# Patient Record
Sex: Female | Born: 1975 | Race: Black or African American | Hispanic: No | Marital: Married | Smoking: Current every day smoker
Health system: Southern US, Community
[De-identification: ages and names within clinical notes are randomized; demographics above are authoritative.]

## PROBLEM LIST (undated history)

## (undated) HISTORY — PX: REPLACEMENT TOTAL HIP W/  RESURFACING IMPLANTS: SUR1222

## (undated) HISTORY — PX: DILATION AND CURETTAGE OF UTERUS: SHX78

---

## 2004-01-31 ENCOUNTER — Emergency Department: Payer: Self-pay | Admitting: Unknown Physician Specialty

## 2006-12-13 ENCOUNTER — Emergency Department: Payer: Self-pay | Admitting: Emergency Medicine

## 2008-04-07 ENCOUNTER — Emergency Department: Payer: Self-pay | Admitting: Emergency Medicine

## 2009-05-19 ENCOUNTER — Ambulatory Visit: Payer: Self-pay | Admitting: Family Medicine

## 2009-06-19 ENCOUNTER — Ambulatory Visit: Payer: Self-pay | Admitting: Family Medicine

## 2009-07-19 ENCOUNTER — Emergency Department: Payer: Self-pay | Admitting: Emergency Medicine

## 2009-10-02 ENCOUNTER — Emergency Department: Payer: Self-pay | Admitting: Internal Medicine

## 2009-10-12 ENCOUNTER — Ambulatory Visit: Payer: Self-pay | Admitting: Gastroenterology

## 2009-10-12 ENCOUNTER — Other Ambulatory Visit: Payer: Self-pay | Admitting: Gastroenterology

## 2010-05-01 ENCOUNTER — Emergency Department: Payer: Self-pay | Admitting: Emergency Medicine

## 2011-12-05 IMAGING — CT CT ABDOMEN W/ CM
1 of 2 series · 15 of 32 positions shown, 20 images · IV contrast (isovue)
Comparison: none

REASON FOR EXAM: nausea vomiting and constipation
COMMENTS:

PROCEDURE:     KCT - KCT ABDOMEN STANDARD W  - October 12, 2009  [DATE]
RESULT:     Comparison:  None
TECHNIQUE: Multiple axial images of the abdomen from the lung bases to the
iliac crests, with p.o. contrast and with 100 mL of Isovue 370 intravenous
contrast.

[Series 2: abd with 5.0 i40f · axial · 0.68mm/px · z∈[-862,-602]mm · 15 of 58 slices shown, 20 images]
[im 3/58  soft-tissue]
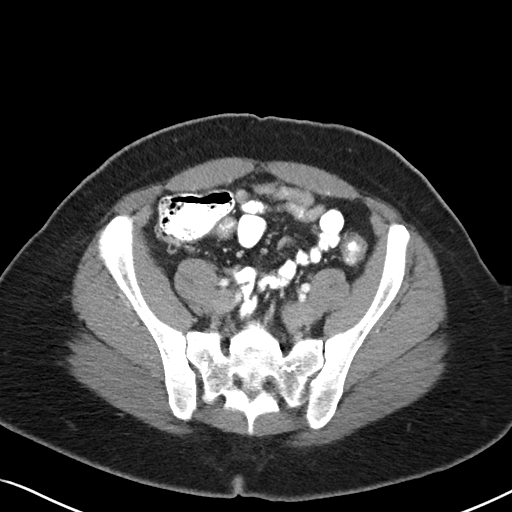
[im 3/58  bone]
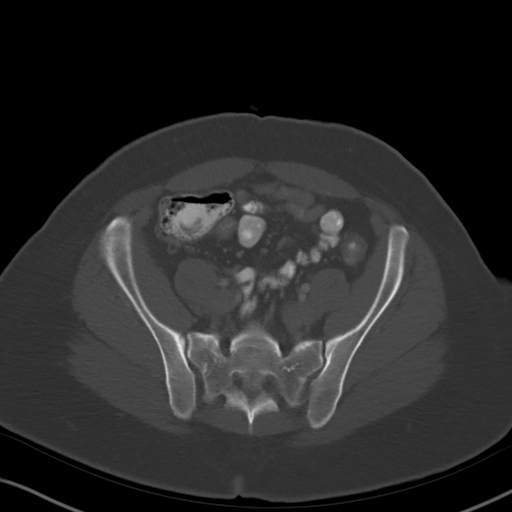
[im 9/58  soft-tissue]
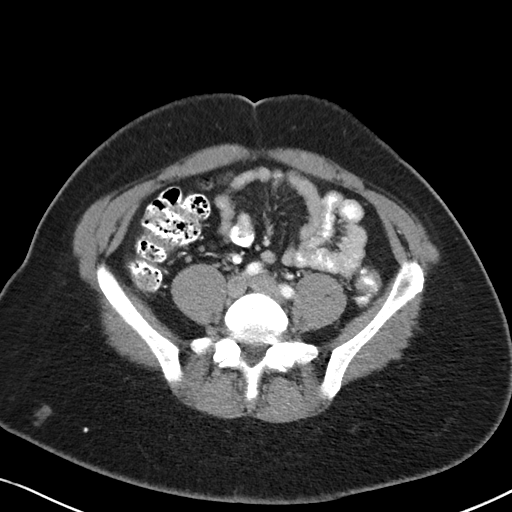
[im 11/58  soft-tissue]
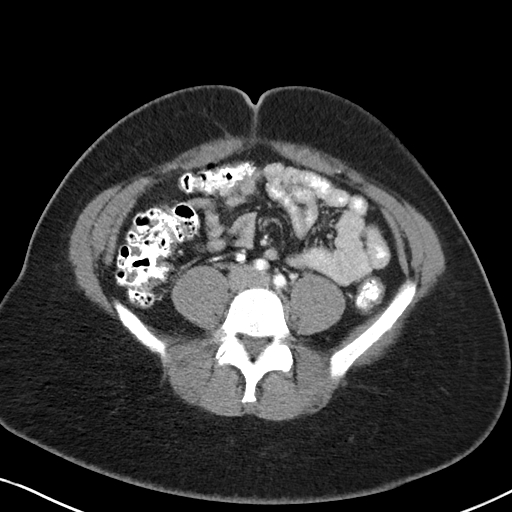
[im 17/58  soft-tissue]
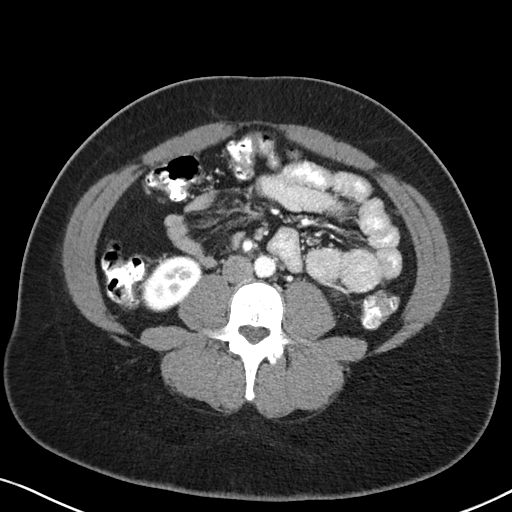
[im 20/58  soft-tissue]
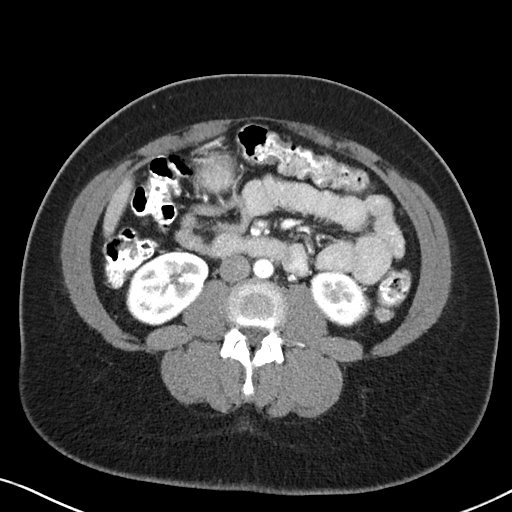
[im 22/58  soft-tissue]
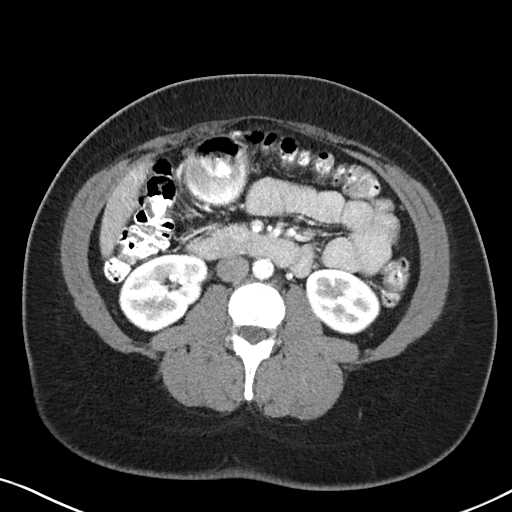
[im 28/58  soft-tissue]
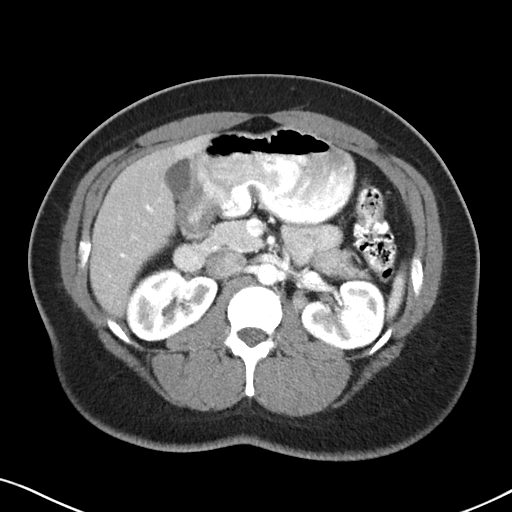
[im 30/58  soft-tissue]
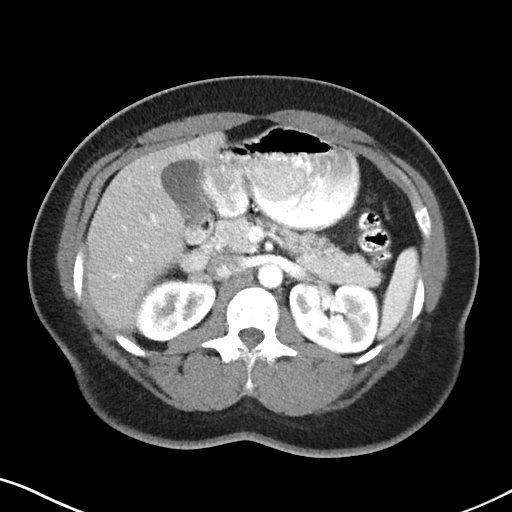
[im 36/58  soft-tissue]
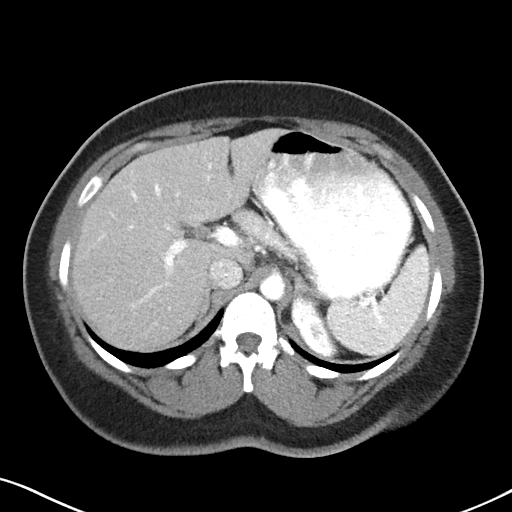
[im 36/58  bone]
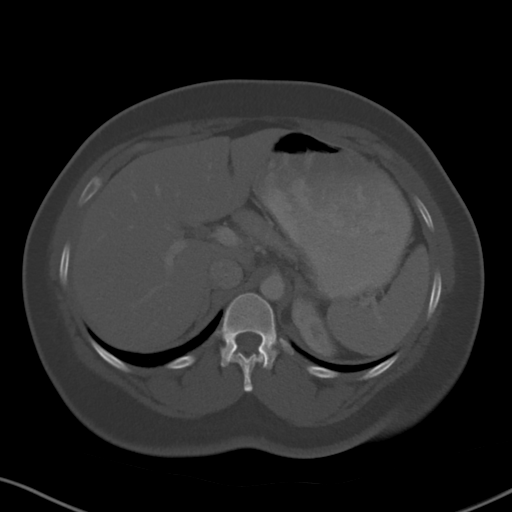
[im 39/58  soft-tissue]
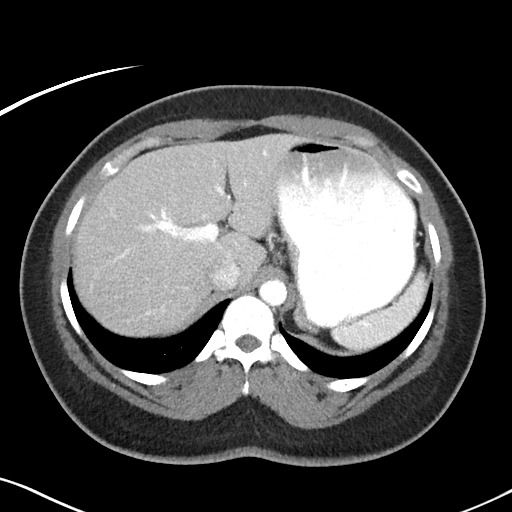
[im 44/58  soft-tissue]
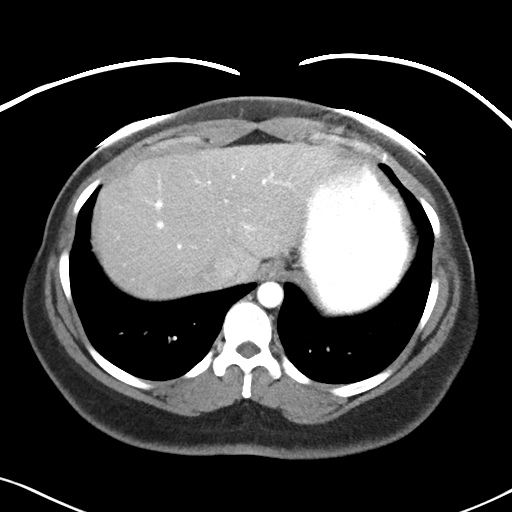
[im 47/58  soft-tissue]
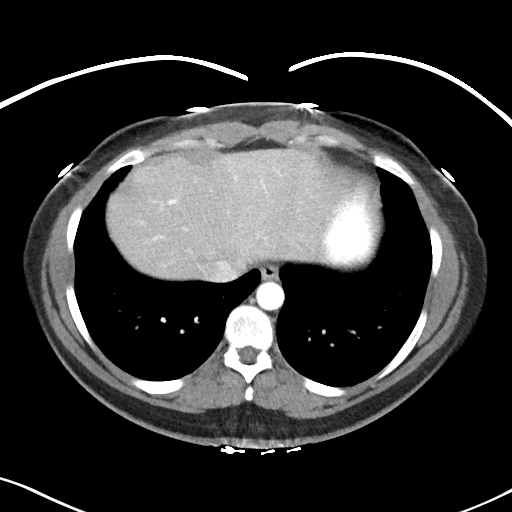
[im 47/58  lung]
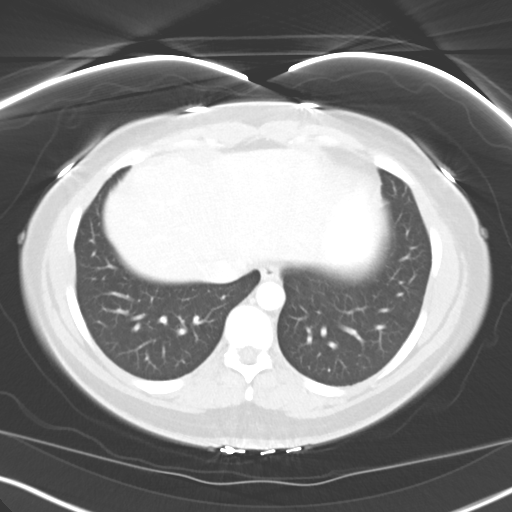
[im 49/58  soft-tissue]
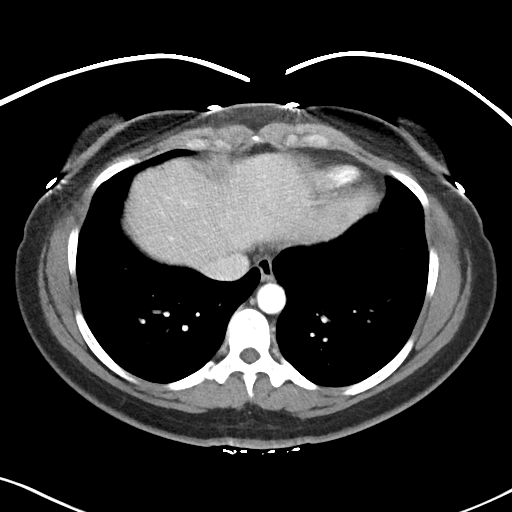
[im 49/58  lung]
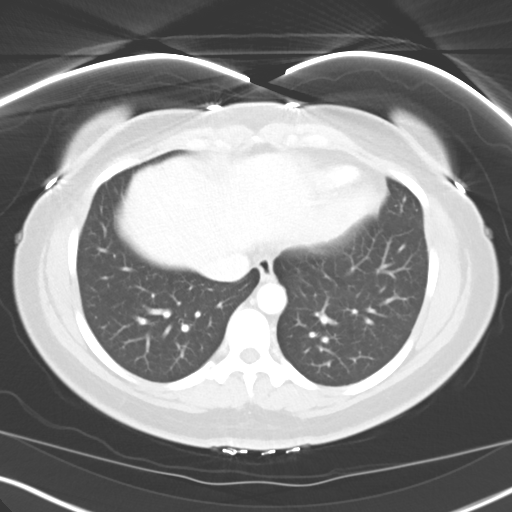
[im 52/58  lung]
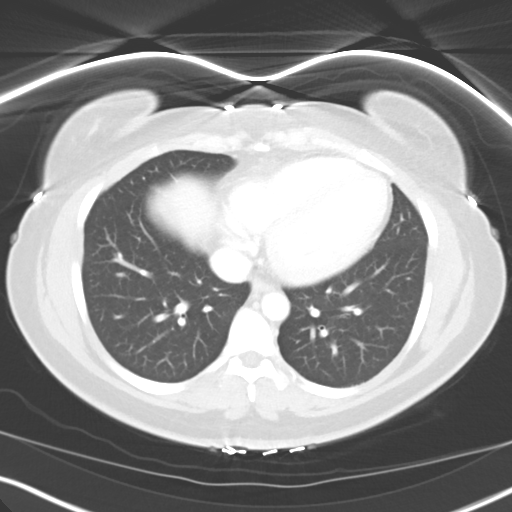
[im 55/58  soft-tissue]
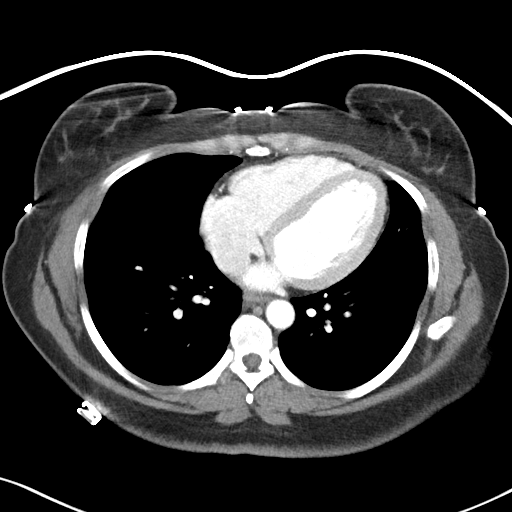
[im 55/58  lung]
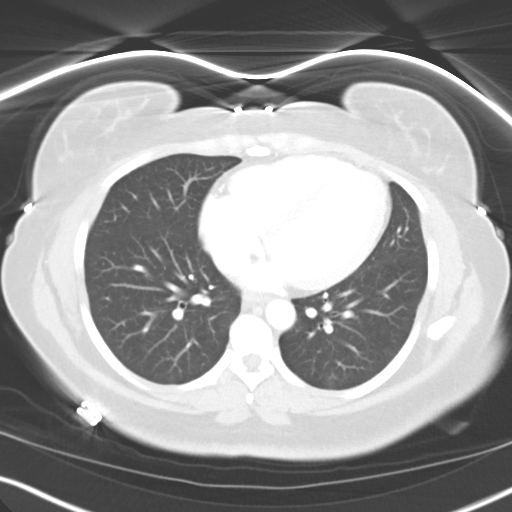

[15 of 32 positions shown; findings below may reference images not displayed]

FINDINGS: The liver, gallbladder, spleen, adrenals, and pancreas are unremarkable. The
kidneys enhance normally.

No lymphadenopathy in the visualized retroperitoneum and mesentery.
Visualized small and large bowel are normal in caliber.

No aggressive lytic or sclerotic osseous lesions.

The
IMPRESSION: No findings in the abdomen to explain the etiology of the patient's pain.

## 2012-06-23 IMAGING — CR DG CHEST 2V
1 series · 2 of 2 positions shown · non-contrast
Comparison: none

REASON FOR EXAM: chest pain and short of breath  -  ed waiting room
COMMENTS:   May transport without cardiac monitor

PROCEDURE:     DXR - DXR CHEST PA (OR AP) AND LATERAL  - May 01, 2010  [DATE]
RESULT:     Comparison: None

[Series 1: view not recorded · 0.17mm/px · 2 of 2 slices shown]
[im 1/2]
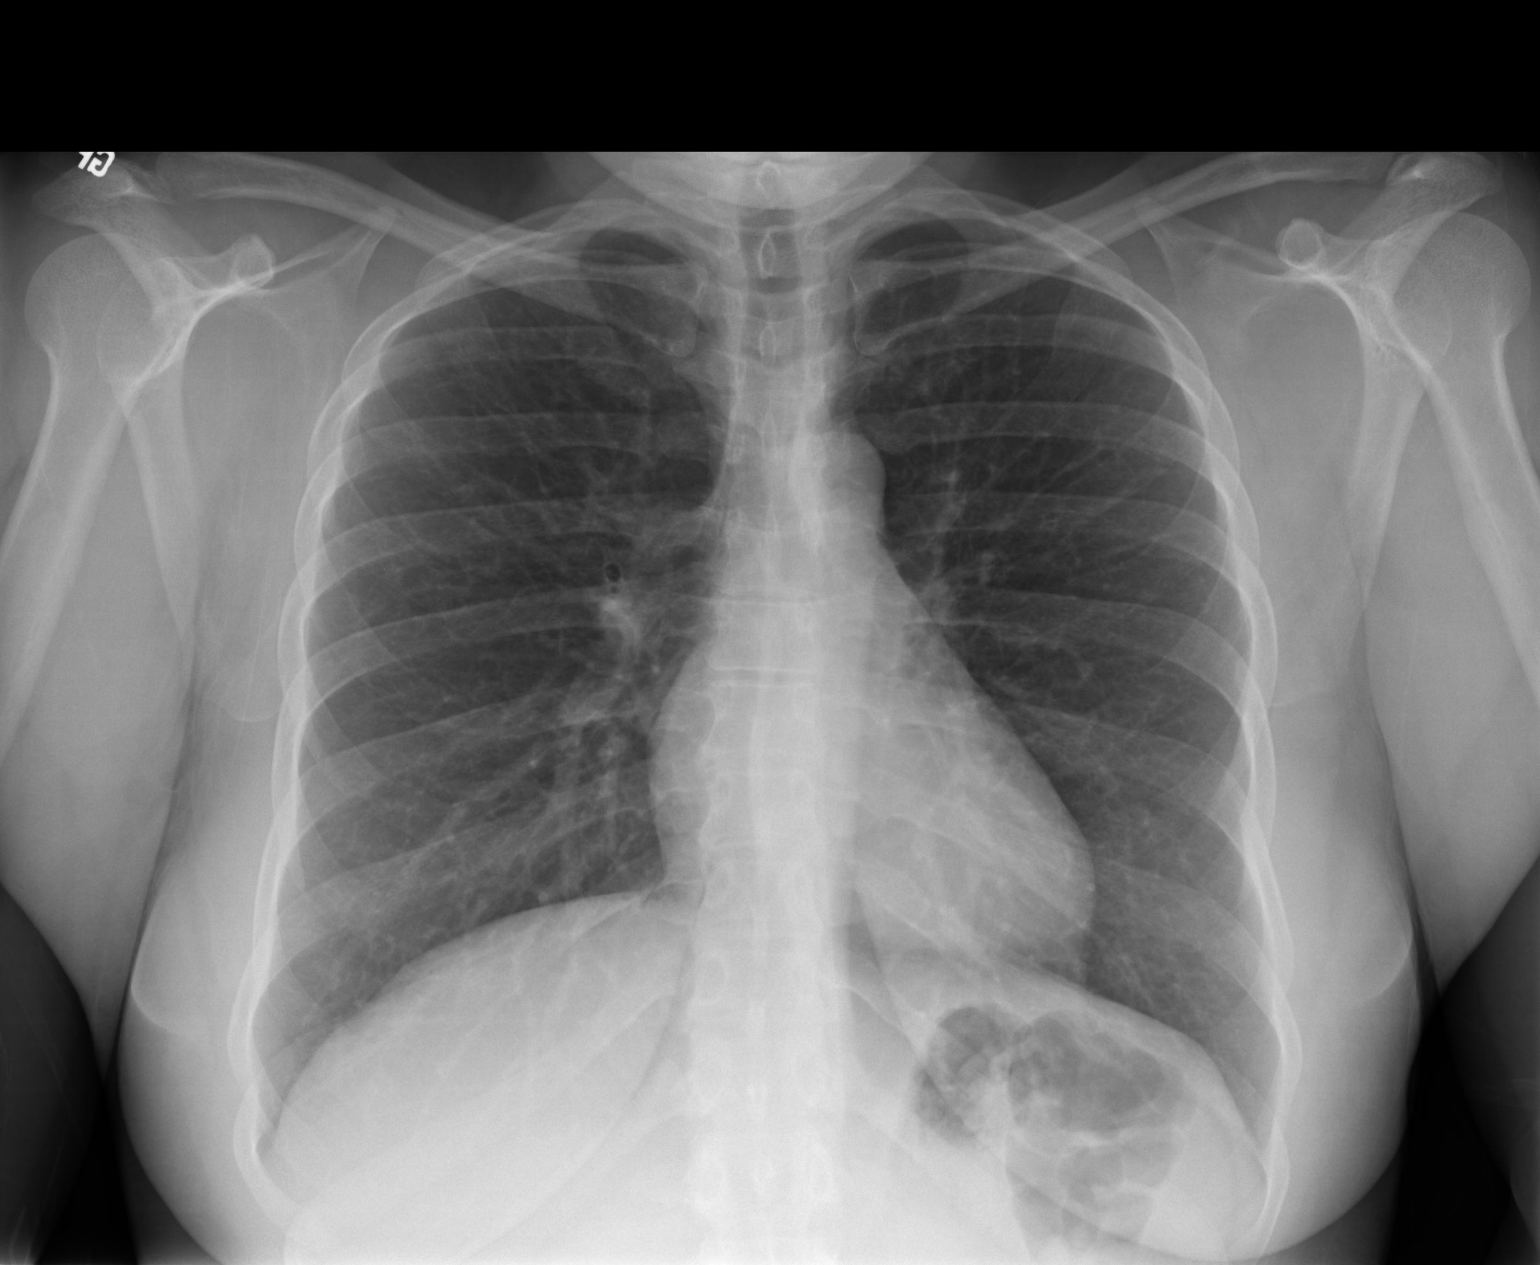
[im 2/2]
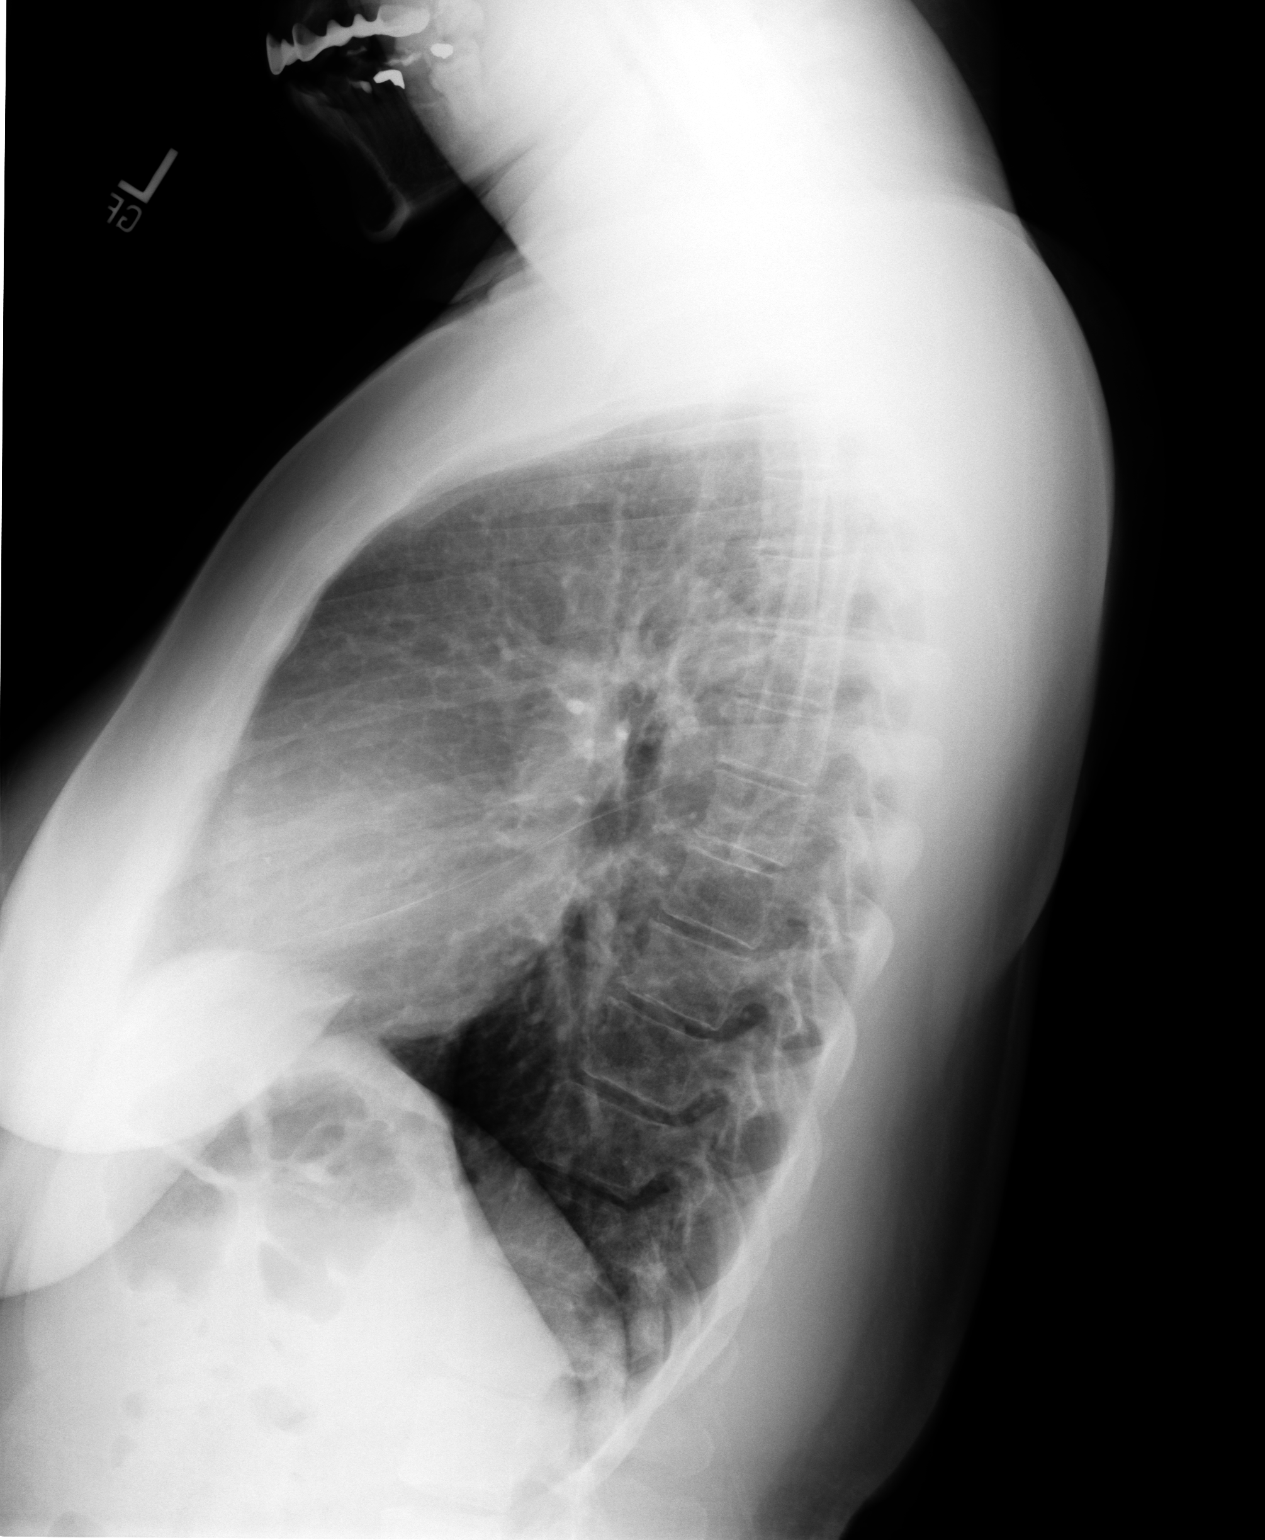

[2 of 2 positions shown; findings below may reference images not displayed]

FINDINGS: PA and lateral chest radiographs are provided.  There is no focal
parenchymal opacity, pleural effusion, or pneumothorax. The heart and
mediastinum are unremarkable.  The osseous structures are unremarkable.
IMPRESSION: No acute disease of the chest.

## 2021-05-28 ENCOUNTER — Encounter: Payer: Self-pay | Admitting: Obstetrics and Gynecology

## 2021-05-31 NOTE — Progress Notes (Signed)
? ? ? ?GYNECOLOGY PROGRESS NOTE ? ?Subjective:  ? ? Patient ID: Gwendolyn Grant, female    DOB: 1975-03-31, 46 y.o.   MRN: [id number] ? ?HPI ? Patient is a 46 y.o. [phone] female who presents as a referral from her PCP [facility] [place], [place]) for abnormal pap smear.  Patient with NILM pap HPV+ (neg 16/18) on 04/22/2021.  Denies complaints today.  ? ? ?Past Medical History:  ?Diagnosis Date  ? Alopecia 06/01/2021  ? Essential hypertension 06/01/2021  ? Hidradenitis suppurativa 06/01/2021  ? Neurogenic sleep apnea 06/01/2021  ? Nicotine dependence, cigarettes, w unsp disorders 06/01/2021  ? Prediabetes 06/01/2021  ? Vitamin D deficiency 06/01/2021  ? ? ?Past Surgical History:  ?Procedure Laterality Date  ? CESAREAN SECTION    ? DILATION AND CURETTAGE OF UTERUS    ? 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 ? ?Social History  ? ?Socioeconomic History  ? Marital status: Married  ?  Spouse name: Not on file  ? Number of children: Not on file  ? Years of education: Not on file  ? Highest education level: Not on file  ?Occupational History  ? Not on file  ?Tobacco Use  ? Smoking status: Every Day  ?  Years: 20.00  ?  Types: Cigarettes  ? Smokeless tobacco: Never  ?Vaping Use  ? Vaping Use: Not on file  ?Substance and Sexual Activity  ? Alcohol use: Not Currently  ? Drug use: Yes  ?  Frequency: 1.0 times per week  ?  Types: Marijuana  ? Sexual activity: Not on file  ?Other Topics Concern  ? Not on file  ?Social History Narrative  ? Not on file  ? ?Social Determinants of Health  ? ?Financial Resource Strain: Not on file  ?Food Insecurity: Not on file  ?Transportation Needs: Not on file  ?Physical Activity: Not on file  ?Stress: Not on file  ?Social Connections: Not on file  ?Intimate Partner Violence: Not on file  ? ? ?Current Outpatient Medications on File Prior to Visit  ?Medication Sig Dispense Refill  ? amLODipine-benazepril (LOTREL) 10-20 MG capsule Take by mouth.    ? LORazepam (ATIVAN) 0.5  MG tablet Take by mouth.    ? ALPRAZolam (XANAX) 0.5 MG tablet Take by mouth.    ? ?No current facility-administered medications on file prior to visit.  ? ? ?Allergies  ?Allergen Reactions  ? Azithromycin Itching  ? Morphine Other (See Comments)  ? ? ?Review of Systems ?A comprehensive review of systems was negative.  ? ?Objective:  ? Blood pressure (!) 158/92, pulse 63, height 5\' 3"  (1.6 m), weight 214 lb (97.1 kg). Body mass index is 37.91 kg/m?. ?General appearance: alert and no distress ?Remainder of exam deferred.  ? ? ?Assessment:  ? ?1. History of infection due to human papilloma virus (HPV)   ? ? ?Plan:  ? ?Patient with HPV infection, however negative for 16/18/45, otherwise normal pap smear. Had discussion regarding HPV infection, including transmission, risk of cervical cancer or other types of cancers based on typing. Advised that although positive, her risk is still fairly low as it is negative for the types that can lead to cervical cancer. She does not require a colposcopy at this time. Advised that we will follow ASCCP guidelines and manage accordingly. Recommendations are for repeat pap smear in 1 year. Given handout on HPV.  ?Discussed Gardasil vaccine, including risks and benefits. Patient notes she would like to start  vaccination series. First dose given today. To f/u in 1 month.  ? ? ?Rubie Maid, MD ?[facility] ? ? ?

## 2021-06-01 ENCOUNTER — Ambulatory Visit (INDEPENDENT_AMBULATORY_CARE_PROVIDER_SITE_OTHER): Payer: BLUE CROSS/BLUE SHIELD | Admitting: Obstetrics and Gynecology

## 2021-06-01 ENCOUNTER — Encounter: Payer: Self-pay | Admitting: Obstetrics and Gynecology

## 2021-06-01 VITALS — BP 158/92 | HR 63 | Ht 63.0 in | Wt 214.0 lb

## 2021-06-01 DIAGNOSIS — Z8619 Personal history of other infectious and parasitic diseases: Secondary | ICD-10-CM | POA: Diagnosis not present

## 2021-06-01 DIAGNOSIS — L659 Nonscarring hair loss, unspecified: Secondary | ICD-10-CM

## 2021-06-01 DIAGNOSIS — Z23 Encounter for immunization: Secondary | ICD-10-CM | POA: Diagnosis not present

## 2021-06-01 DIAGNOSIS — I10 Essential (primary) hypertension: Secondary | ICD-10-CM

## 2021-06-01 DIAGNOSIS — F17219 Nicotine dependence, cigarettes, with unspecified nicotine-induced disorders: Secondary | ICD-10-CM

## 2021-06-01 DIAGNOSIS — G4731 Primary central sleep apnea: Secondary | ICD-10-CM

## 2021-06-01 DIAGNOSIS — L732 Hidradenitis suppurativa: Secondary | ICD-10-CM

## 2021-06-01 DIAGNOSIS — R7303 Prediabetes: Secondary | ICD-10-CM | POA: Insufficient documentation

## 2021-06-01 DIAGNOSIS — E559 Vitamin D deficiency, unspecified: Secondary | ICD-10-CM | POA: Insufficient documentation

## 2021-06-01 HISTORY — DX: Primary central sleep apnea: G47.31

## 2021-06-01 HISTORY — DX: Vitamin D deficiency, unspecified: E55.9

## 2021-06-01 HISTORY — DX: Prediabetes: R73.03

## 2021-06-01 HISTORY — DX: Nicotine dependence, cigarettes, with unspecified nicotine-induced disorders: F17.219

## 2021-06-01 HISTORY — DX: Essential (primary) hypertension: I10

## 2021-06-01 HISTORY — DX: Nonscarring hair loss, unspecified: L65.9

## 2021-06-01 HISTORY — DX: Hidradenitis suppurativa: L73.2

## 2021-06-01 NOTE — Patient Instructions (Signed)
HPV (Human Papillomavirus) Vaccine: What You Need to Know 1. Why get vaccinated? HPV (human papillomavirus) vaccine can prevent infection with some types of human papillomavirus. HPV infections can cause certain types of cancers, including: cervical, vaginal, and vulvar cancers in women penile cancer in men anal cancers in both men and women cancers of tonsils, base of tongue, and back of throat (oropharyngeal cancer) in both men and women HPV infections can also cause anogenital warts. HPV vaccine can prevent over 90% of cancers caused by HPV. HPV is spread through intimate skin-to-skin or sexual contact. HPV infections are so common that nearly all people will get at least one type of HPV at some time in their lives. Most HPV infections go away on their own within 2 years. But sometimes HPV infections will last longer and can cause cancers later in life. 2. HPV vaccine HPV vaccine is routinely recommended for adolescents at 11 or 46 years of age to ensure they are protected before they are exposed to the virus. HPV vaccine may be given beginning at age 9 years and vaccination is recommended for everyone through 46 years of age. HPV vaccine may be given to adults 27 through 45 years of age, based on discussions between the patient and health care provider. Most children who get the first dose before 15 years of age need 2 doses of HPV vaccine. People who get the first dose at or after 15 years of age and younger people with certain immunocompromising conditions need 3 doses. Your health care provider can give you more information. HPV vaccine may be given at the same time as other vaccines. 3. Talk with your health care provider Tell your vaccination provider if the person getting the vaccine: Has had an allergic reaction after a previous dose of HPV vaccine, or has any severe, life-threatening allergies Is pregnant--HPV vaccine is not recommended until after pregnancy In some cases, your health  care provider may decide to postpone HPV vaccination until a future visit. People with minor illnesses, such as a cold, may be vaccinated. People who are moderately or severely ill should usually wait until they recover before getting HPV vaccine. Your health care provider can give you more information. 4. Risks of a vaccine reaction Soreness, redness, or swelling where the shot is given can happen after HPV vaccination. Fever or headache can happen after HPV vaccination. People sometimes faint after medical procedures, including vaccination. Tell your provider if you feel dizzy or have vision changes or ringing in the ears. As with any medicine, there is a very remote chance of a vaccine causing a severe allergic reaction, other serious injury, or death. 5. What if there is a serious problem? An allergic reaction could occur after the vaccinated person leaves the clinic. If you see signs of a severe allergic reaction (hives, swelling of the face and throat, difficulty breathing, a fast heartbeat, dizziness, or weakness), call 9-1-1 and get the person to the nearest hospital. For other signs that concern you, call your health care provider. Adverse reactions should be reported to the Vaccine Adverse Event Reporting System (VAERS). Your health care provider will usually file this report, or you can do it yourself. Visit the VAERS website at www.vaers.hhs.gov or call 1-800-822-7967. VAERS is only for reporting reactions, and VAERS staff members do not give medical advice. 6. The National Vaccine Injury Compensation Program The National Vaccine Injury Compensation Program (VICP) is a federal program that was created to compensate people who may have been injured   by certain vaccines. Claims regarding alleged injury or death due to vaccination have a time limit for filing, which may be as short as two years. Visit the VICP website at www.hrsa.gov/vaccinecompensation or call 1-800-338-2382 to learn about the  program and about filing a claim. 7. How can I learn more? Ask your health care provider. Call your local or state health department. Visit the website of the Food and Drug Administration (FDA) for vaccine package inserts and additional information at www.fda.gov/vaccines-blood-biologics/vaccines. Contact the Centers for Disease Control and Prevention (CDC): Call 1-800-232-4636 (1-800-CDC-INFO) or Visit CDC's website at www.cdc.gov/vaccines. Source: CDC Vaccine Information Statement HPV Vaccine (08/23/2019) This same material is available at www.cdc.gov for no charge. This information is not intended to replace advice given to you by your health care provider. Make sure you discuss any questions you have with your health care provider. Document Revised: 12/02/2020 Document Reviewed: 09/24/2020 Elsevier Patient Education  2023 Elsevier Inc.  

## 2021-07-02 ENCOUNTER — Ambulatory Visit (INDEPENDENT_AMBULATORY_CARE_PROVIDER_SITE_OTHER): Payer: BLUE CROSS/BLUE SHIELD | Admitting: Obstetrics and Gynecology

## 2021-07-02 DIAGNOSIS — Z23 Encounter for immunization: Secondary | ICD-10-CM

## 2021-07-02 DIAGNOSIS — Z8619 Personal history of other infectious and parasitic diseases: Secondary | ICD-10-CM

## 2021-07-02 NOTE — Progress Notes (Unsigned)
HPV vaccination given patient tolerated will no issues or  concerns.

## 2021-07-04 ENCOUNTER — Encounter: Payer: Self-pay | Admitting: Obstetrics and Gynecology

## 2021-07-30 ENCOUNTER — Ambulatory Visit: Payer: BLUE CROSS/BLUE SHIELD

## 2021-08-18 ENCOUNTER — Ambulatory Visit: Payer: Self-pay | Admitting: Dermatology

## 2021-08-18 DIAGNOSIS — L732 Hidradenitis suppurativa: Secondary | ICD-10-CM

## 2021-08-18 DIAGNOSIS — L709 Acne, unspecified: Secondary | ICD-10-CM

## 2021-08-18 DIAGNOSIS — L905 Scar conditions and fibrosis of skin: Secondary | ICD-10-CM

## 2021-08-18 DIAGNOSIS — L819 Disorder of pigmentation, unspecified: Secondary | ICD-10-CM

## 2021-08-18 MED ORDER — DOXYCYCLINE HYCLATE 100 MG PO TABS: 100.0000 mg | ORAL_TABLET | Freq: Every day | ORAL | 1 refills | Status: DC

## 2021-08-18 NOTE — Progress Notes (Signed)
   New Patient Visit  Subjective  Gwendolyn Grant is a 46 y.o. female who presents for the following: Other (History of Hidradenitis of groin, inframammary - no active areas today. She has had some breakouts on face recently. Has not been treated with any medications. She gets a new boil about 6 times per year/).  The following portions of the chart were reviewed this encounter and updated as appropriate:   Tobacco  Allergies  Meds  Problems  Med Hx  Surg Hx  Fam Hx     Review of Systems:  No other skin or systemic complaints except as noted in HPI or Assessment and Plan.  Objective  Well appearing patient in no apparent distress; mood and affect are within normal limits.  A focused examination was performed including scalp, face, inframammary. Relevant physical exam findings are noted in the Assessment and Plan.  Scarring with dyspigmentation of anterior scalp and face. Hyperpigmented papule of left underside breast and scarring of inframammary.        Assessment & Plan  Hidradenitis suppurativa  Hidradenitis and Acne With significant scarring and dyschromia  Chronic and persistent condition with duration or expected duration over one year. Condition is symptomatic / bothersome to patient. Not to goal.  See photos Hidradenitis Suppurativa is a chronic; persistent; non-curable, but treatable condition due to abnormal inflamed sweat glands in the body folds (axilla, inframammary, groin, medial thighs), causing recurrent painful draining cysts and scarring. It can be associated with severe scarring acne and cysts; also abscesses and scarring of scalp. The goal is control and prevention of flares, as it is not curable. Scars are permanent and can be thickened. Treatment may include daily use of topical medication and oral antibiotics.  Oral isotretinoin may also be helpful.  For more severe cases, Humira (a biologic injection) may be prescribed to decrease the inflammatory process  and prevent flares.  When indicated, inflamed cysts may also be treated surgically.  Start Doxycycline 100 mg 1 po qd with food and plenty of fluid.  May consider Isotretinoin in the future if not improving. Discussed biologic treatments. Gave informational literature.  doxycycline (VIBRA-TABS) 100 MG tablet Take 1 tablet (100 mg total) by mouth daily. With food and plenty of fluid  Return in about 4 months (around 12/18/2021) for Follow up.  I, Joanie Coddington, CMA, am acting as scribe for Armida Sans, MD . Documentation: I have reviewed the above documentation for accuracy and completeness, and I agree with the above.  Armida Sans, MD

## 2021-08-18 NOTE — Patient Instructions (Signed)
Hidradenitis Suppurativa is a chronic; persistent; non-curable, but treatable condition due to abnormal inflamed sweat glands in the body folds (axilla, inframammary, groin, medial thighs), causing recurrent painful draining cysts and scarring. It can be associated with severe scarring acne and cysts; also abscesses and scarring of scalp. The goal is control and prevention of flares, as it is not curable. Scars are permanent and can be thickened. Treatment may include daily use of topical medication and oral antibiotics.  Oral isotretinoin may also be helpful.  For more severe cases, Humira (a biologic injection) may be prescribed to decrease the inflammatory process and prevent flares.  When indicated, inflamed cysts may also be treated surgically.   Due to recent changes in healthcare laws, you may see results of your pathology and/or laboratory studies on MyChart before the doctors have had a chance to review them. We understand that in some cases there may be results that are confusing or concerning to you. Please understand that not all results are received at the same time and often the doctors may need to interpret multiple results in order to provide you with the best plan of care or course of treatment. Therefore, we ask that you please give us 2 business days to thoroughly review all your results before contacting the office for clarification. Should we see a critical lab result, you will be contacted sooner.   If You Need Anything After Your Visit  If you have any questions or concerns for your doctor, please call our main line at [phone] and press option 4 to reach your doctor's medical assistant. If no one answers, please leave a voicemail as directed and we will return your call as soon as possible. Messages left after 4 pm will be answered the following business day.   You may also send us a message via MyChart. We typically respond to MyChart messages within 1-2 business days.  For  prescription refills, please ask your pharmacy to contact our office. Our fax number is [phone].  If you have an urgent issue when the clinic is closed that cannot wait until the next business day, you can page your doctor at the number below.    Please note that while we do our best to be available for urgent issues outside of office hours, we are not available 24/7.   If you have an urgent issue and are unable to reach us, you may choose to seek medical care at your doctor's office, retail clinic, urgent care center, or emergency room.  If you have a medical emergency, please immediately call 911 or go to the emergency department.  Pager Numbers  - Dr. Kowalski: [phone]  - Dr. Moye: [phone]  - Dr. Stewart: [phone]  In the event of inclement weather, please call our main line at [phone] for an update on the status of any delays or closures.  Dermatology Medication Tips: Please keep the boxes that topical medications come in in order to help keep track of the instructions about where and how to use these. Pharmacies typically print the medication instructions only on the boxes and not directly on the medication tubes.   If your medication is too expensive, please contact our office at [phone] option 4 or send us a message through MyChart.   We are unable to tell what your co-pay for medications will be in advance as this is different depending on your insurance coverage. However, we may be able to find a substitute medication at lower cost or   fill out paperwork to get insurance to cover a needed medication.   If a prior authorization is required to get your medication covered by your insurance company, please allow us 1-2 business days to complete this process.  Drug prices often vary depending on where the prescription is filled and some pharmacies may offer cheaper prices.  The website www.goodrx.com contains coupons for medications through different  pharmacies. The prices here do not account for what the cost may be with help from insurance (it may be cheaper with your insurance), but the website can give you the price if you did not use any insurance.  - You can print the associated coupon and take it with your prescription to the pharmacy.  - You may also stop by our office during regular business hours and pick up a GoodRx coupon card.  - If you need your prescription sent electronically to a different pharmacy, notify our office through [facility] MyChart or by phone at [phone] option 4.     Si Usted Necesita Algo Despus de Su Visita  Tambin puede enviarnos un mensaje a travs de MyChart. Por lo general respondemos a los mensajes de MyChart en el transcurso de 1 a 2 das hbiles.  Para renovar recetas, por favor pida a su farmacia que se ponga en contacto con nuestra oficina. Nuestro nmero de fax es el [phone].  Si tiene un asunto urgente cuando la clnica est cerrada y que no puede esperar hasta el siguiente da hbil, puede llamar/localizar a su doctor(a) al nmero que aparece a continuacin.   Por favor, tenga en cuenta que aunque hacemos todo lo posible para estar disponibles para asuntos urgentes fuera del horario de oficina, no estamos disponibles las 24 horas del da, los 7 das de la semana.   Si tiene un problema urgente y no puede comunicarse con nosotros, puede optar por buscar atencin mdica  en el consultorio de su doctor(a), en una clnica privada, en un centro de atencin urgente o en una sala de emergencias.  Si tiene una emergencia mdica, por favor llame inmediatamente al 911 o vaya a la sala de emergencias.  Nmeros de bper  - Dr. Kowalski: [phone]  - Dra. Moye: [phone]  - Dra. Stewart: [phone]  En caso de inclemencias del tiempo, por favor llame a nuestra lnea principal al [phone] para una actualizacin sobre el estado de cualquier retraso o cierre.  Consejos para la  medicacin en dermatologa: Por favor, guarde las cajas en las que vienen los medicamentos de uso tpico para ayudarle a seguir las instrucciones sobre dnde y cmo usarlos. Las farmacias generalmente imprimen las instrucciones del medicamento slo en las cajas y no directamente en los tubos del medicamento.   Si su medicamento es muy caro, por favor, pngase en contacto con nuestra oficina llamando al [phone] y presione la opcin 4 o envenos un mensaje a travs de MyChart.   No podemos decirle cul ser su copago por los medicamentos por adelantado ya que esto es diferente dependiendo de la cobertura de su seguro. Sin embargo, es posible que podamos encontrar un medicamento sustituto a menor costo o llenar un formulario para que el seguro cubra el medicamento que se considera necesario.   Si se requiere una autorizacin previa para que su compaa de seguros cubra su medicamento, por favor permtanos de 1 a 2 das hbiles para completar este proceso.  Los precios de los medicamentos varan con frecuencia dependiendo del lugar de dnde se surte la receta   y alguna farmacias pueden ofrecer precios ms baratos.  El sitio web www.goodrx.com tiene cupones para medicamentos de diferentes farmacias. Los precios aqu no tienen en cuenta lo que podra costar con la ayuda del seguro (puede ser ms barato con su seguro), pero el sitio web puede darle el precio si no utiliz ningn seguro.  - Puede imprimir el cupn correspondiente y llevarlo con su receta a la farmacia.  - Tambin puede pasar por nuestra oficina durante el horario de atencin regular y recoger una tarjeta de cupones de GoodRx.  - Si necesita que su receta se enve electrnicamente a una farmacia diferente, informe a nuestra oficina a travs de MyChart de [place] o por telfono llamando al [phone] y presione la opcin 4.  

## 2021-08-19 ENCOUNTER — Encounter: Payer: Self-pay | Admitting: Dermatology

## 2021-11-15 ENCOUNTER — Ambulatory Visit: Payer: BLUE CROSS/BLUE SHIELD | Admitting: Psychiatry

## 2021-12-23 ENCOUNTER — Ambulatory Visit: Payer: Self-pay | Admitting: Dermatology

## 2021-12-24 ENCOUNTER — Ambulatory Visit: Payer: BLUE CROSS/BLUE SHIELD

## 2021-12-24 ENCOUNTER — Ambulatory Visit (INDEPENDENT_AMBULATORY_CARE_PROVIDER_SITE_OTHER): Payer: BLUE CROSS/BLUE SHIELD

## 2021-12-24 VITALS — BP 130/91 | HR 97 | Resp 16 | Ht 63.0 in | Wt 208.7 lb

## 2021-12-24 DIAGNOSIS — Z23 Encounter for immunization: Secondary | ICD-10-CM

## 2021-12-24 NOTE — Progress Notes (Signed)
    Nurse Visit Note  Subjective:    Patient ID: Gwendolyn Grant, female    DOB: [birth year], 46 y.o.   MRN: [id number]  HPI  Patient is a 46 y.o. [id number] female who presents for Gardasil injection. Patient is here for her final Gardasil injection. She has no complaints, no side effects.  The following portions of the patient's history were reviewed and updated as appropriate: allergies, current medications, past family history, past medical history, past social history, past surgical history, and problem list.  Review of Systems Pertinent items are noted in HPI.   Objective:   Blood pressure (!) 130/91, pulse 97, resp. rate 16, height 5\' 3"  (1.6 m), weight 208 lb 11.2 oz (94.7 kg). Body mass index is 36.97 kg/m. General appearance: alert, cooperative, and no distress   Assessment:   1. Need for HPV vaccine      Plan:   There are no diagnoses linked to this encounter.    , CMA [facility]

## 2021-12-24 NOTE — Patient Instructions (Signed)
HPV (Human Papillomavirus) Vaccine: What You Need to Know 1. Why get vaccinated? HPV (human papillomavirus) vaccine can prevent infection with some types of human papillomavirus. HPV infections can cause certain types of cancers, including: cervical, vaginal, and vulvar cancers in women penile cancer in men anal cancers in both men and women cancers of tonsils, base of tongue, and back of throat (oropharyngeal cancer) in both men and women HPV infections can also cause anogenital warts. HPV vaccine can prevent over 90% of cancers caused by HPV. HPV is spread through intimate skin-to-skin or sexual contact. HPV infections are so common that nearly all people will get at least one type of HPV at some time in their lives. Most HPV infections go away on their own within 2 years. But sometimes HPV infections will last longer and can cause cancers later in life. 2. HPV vaccine HPV vaccine is routinely recommended for adolescents at 11 or 46 years of age to ensure they are protected before they are exposed to the virus. HPV vaccine may be given beginning at age 9 years and vaccination is recommended for everyone through 46 years of age. HPV vaccine may be given to adults 27 through 45 years of age, based on discussions between the patient and health care provider. Most children who get the first dose before 15 years of age need 2 doses of HPV vaccine. People who get the first dose at or after 15 years of age and younger people with certain immunocompromising conditions need 3 doses. Your health care provider can give you more information. HPV vaccine may be given at the same time as other vaccines. 3. Talk with your health care provider Tell your vaccination provider if the person getting the vaccine: Has had an allergic reaction after a previous dose of HPV vaccine, or has any severe, life-threatening allergies Is pregnant--HPV vaccine is not recommended until after pregnancy In some cases, your health  care provider may decide to postpone HPV vaccination until a future visit. People with minor illnesses, such as a cold, may be vaccinated. People who are moderately or severely ill should usually wait until they recover before getting HPV vaccine. Your health care provider can give you more information. 4. Risks of a vaccine reaction Soreness, redness, or swelling where the shot is given can happen after HPV vaccination. Fever or headache can happen after HPV vaccination. People sometimes faint after medical procedures, including vaccination. Tell your provider if you feel dizzy or have vision changes or ringing in the ears. As with any medicine, there is a very remote chance of a vaccine causing a severe allergic reaction, other serious injury, or death. 5. What if there is a serious problem? An allergic reaction could occur after the vaccinated person leaves the clinic. If you see signs of a severe allergic reaction (hives, swelling of the face and throat, difficulty breathing, a fast heartbeat, dizziness, or weakness), call 9-1-1 and get the person to the nearest hospital. For other signs that concern you, call your health care provider. Adverse reactions should be reported to the Vaccine Adverse Event Reporting System (VAERS). Your health care provider will usually file this report, or you can do it yourself. Visit the VAERS website at www.vaers.hhs.gov or call 1-800-822-7967. VAERS is only for reporting reactions, and VAERS staff members do not give medical advice. 6. The National Vaccine Injury Compensation Program The National Vaccine Injury Compensation Program (VICP) is a federal program that was created to compensate people who may have been injured   by certain vaccines. Claims regarding alleged injury or death due to vaccination have a time limit for filing, which may be as short as two years. Visit the VICP website at www.hrsa.gov/vaccinecompensation or call 1-800-338-2382 to learn about the  program and about filing a claim. 7. How can I learn more? Ask your health care provider. Call your local or state health department. Visit the website of the Food and Drug Administration (FDA) for vaccine package inserts and additional information at www.fda.gov/vaccines-blood-biologics/vaccines. Contact the Centers for Disease Control and Prevention (CDC): Call 1-800-232-4636 (1-800-CDC-INFO) or Visit CDC's website at www.cdc.gov/vaccines. Source: CDC Vaccine Information Statement HPV Vaccine (08/23/2019) This same material is available at www.cdc.gov for no charge. This information is not intended to replace advice given to you by your health care provider. Make sure you discuss any questions you have with your health care provider. Document Revised: 11/30/2020 Document Reviewed: 09/24/2020 Elsevier Patient Education  2023 Elsevier Inc.  

## 2022-03-17 ENCOUNTER — Other Ambulatory Visit: Payer: Self-pay | Admitting: Nurse Practitioner

## 2022-03-17 ENCOUNTER — Other Ambulatory Visit: Payer: BC Managed Care – PPO

## 2022-03-17 ENCOUNTER — Ambulatory Visit: Payer: BC Managed Care – PPO | Admitting: Nurse Practitioner

## 2022-03-18 LAB — COMPREHENSIVE METABOLIC PANEL
ALT: 16 IU/L (ref 0–32)
AST: 14 IU/L (ref 0–40)
Albumin/Globulin Ratio: 1.4 (ref 1.2–2.2)
Albumin: 4.3 g/dL (ref 3.9–4.9)
Alkaline Phosphatase: 74 IU/L (ref 44–121)
BUN/Creatinine Ratio: 8 — ABNORMAL LOW (ref 9–23)
BUN: 8 mg/dL (ref 6–24)
Bilirubin Total: 0.4 mg/dL (ref 0.0–1.2)
CO2: 21 mmol/L (ref 20–29)
Calcium: 9.8 mg/dL (ref 8.7–10.2)
Chloride: 106 mmol/L (ref 96–106)
Creatinine, Ser: 1.03 mg/dL — ABNORMAL HIGH (ref 0.57–1.00)
Globulin, Total: 3 g/dL (ref 1.5–4.5)
Glucose: 119 mg/dL — ABNORMAL HIGH (ref 70–99)
Potassium: 4.6 mmol/L (ref 3.5–5.2)
Sodium: 143 mmol/L (ref 134–144)
Total Protein: 7.3 g/dL (ref 6.0–8.5)
eGFR: 68 mL/min/{1.73_m2} (ref >59)

## 2022-03-18 LAB — TSH: TSH: 1.77 u[IU]/mL (ref 0.450–4.500)

## 2022-03-18 LAB — LIPID PANEL W/O CHOL/HDL RATIO
Cholesterol, Total: 143 mg/dL (ref 100–199)
HDL: 33 mg/dL — ABNORMAL LOW (ref >39)
LDL Chol Calc (NIH): 93 mg/dL (ref 0–99)
Triglycerides: 91 mg/dL (ref 0–149)
VLDL Cholesterol Cal: 17 mg/dL (ref 5–40)

## 2022-03-18 LAB — HGB A1C W/O EAG: Hgb A1c MFr Bld: 5.9 % — ABNORMAL HIGH (ref 4.8–5.6)

## 2022-03-21 ENCOUNTER — Ambulatory Visit: Payer: BC Managed Care – PPO | Admitting: Nurse Practitioner

## 2022-03-24 ENCOUNTER — Encounter: Payer: Self-pay | Admitting: Nurse Practitioner

## 2022-03-24 ENCOUNTER — Ambulatory Visit: Payer: BC Managed Care – PPO | Admitting: Nurse Practitioner

## 2022-03-24 VITALS — BP 144/84 | HR 84 | Ht 63.0 in | Wt 213.0 lb

## 2022-03-24 DIAGNOSIS — F32A Depression, unspecified: Secondary | ICD-10-CM | POA: Diagnosis not present

## 2022-03-24 DIAGNOSIS — M545 Low back pain, unspecified: Secondary | ICD-10-CM

## 2022-03-24 DIAGNOSIS — R7303 Prediabetes: Secondary | ICD-10-CM

## 2022-03-24 MED ORDER — ESCITALOPRAM OXALATE 10 MG PO TABS: 10.0000 mg | ORAL_TABLET | Freq: Every day | ORAL | 5 refills | Status: DC

## 2022-03-24 NOTE — Progress Notes (Signed)
Established Patient Office Visit  Subjective:  Patient ID: Gwendolyn Grant, female    DOB: May 06, 1975  Age: 47 y.o. MRN: [id number]  Chief Complaint  Patient presents with   Follow-up    4 month follow up and review lab results.    4 month follow up.  BP not at goal.  Reviewed labs today, prediabetes.  Low back pain, PT referral.     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     Social History   Socioeconomic History   Marital status: Married    Spouse name: Not on file   Number of children: Not on file   Years of education: Not on file   Highest education level: Not on file  Occupational History   Not on file  Tobacco Use   Smoking status: Every Day    Years: 20.00    Types: Cigarettes   Smokeless tobacco: Never  Vaping Use   Vaping Use: Not on file  Substance and Sexual Activity   Alcohol use: Not Currently   Drug use: Yes    Frequency: 1.0 times per week    Types: Marijuana   Sexual activity: Not on file  Other Topics Concern   Not on file  Social History Narrative   Not on file   Social Determinants of Health   Financial Resource Strain: Not on file  Food Insecurity: Not on file  Transportation Needs: Not on file  Physical Activity: Not on file  Stress: Not on file  Social Connections: Not on file  Intimate Partner Violence: Not on file    No family history on file.  Allergies  Allergen Reactions   Azithromycin Itching   Morphine Other (See Comments)    Review of Systems  Constitutional: Negative.   HENT:  Positive for  congestion.   Eyes: Negative.   Respiratory:  Positive for cough.   Cardiovascular: Negative.   Gastrointestinal:  Positive for abdominal pain, constipation, diarrhea and heartburn.  Genitourinary:  Positive for frequency and urgency.  Musculoskeletal:  Positive for back pain.  Skin: Negative.   Neurological:  Positive for headaches.  Endo/Heme/Allergies: Negative.   Psychiatric/Behavioral:  The patient has insomnia.        Objective:   BP (!) 144/84   Pulse 84   Ht '5\' 3"'$  (1.6 m)   Wt 213 lb (96.6 kg)   SpO2 99%   BMI 37.73 kg/m   Vitals:   03/24/22 1346  BP: (!) 144/84  Pulse: 84  Height: '5\' 3"'$  (1.6 m)  Weight: 213 lb (96.6 kg)  SpO2: 99%  BMI (Calculated): 37.74    Physical Exam Vitals reviewed.  Constitutional:      Appearance: Normal appearance.  HENT:     Head: Normocephalic.     Nose: Nose normal.     Mouth/Throat:     Mouth: Mucous membranes are dry.  Eyes:     Pupils: Pupils are equal, round, and reactive to light.  Cardiovascular:     Rate and Rhythm: Normal rate and regular rhythm.  Pulmonary:  Effort: Pulmonary effort is normal.     Breath sounds: Normal breath sounds.  Abdominal:     General: Bowel sounds are normal.     Palpations: Abdomen is soft.  Musculoskeletal:        General: Normal range of motion.     Cervical back: Normal range of motion and neck supple.  Skin:    General: Skin is warm and dry.  Neurological:     Mental Status: She is alert and oriented to person, place, and time.  Psychiatric:        Mood and Affect: Mood normal.        Behavior: Behavior normal.      No results found for any visits on 03/24/22.  Recent Results (from the past 2160 hour(s))  Comprehensive metabolic panel     Status: Abnormal   Collection Time: 03/17/22 11:32 AM  Result Value Ref Range   Glucose 119 (H) 70 - 99 mg/dL   BUN 8 6 - 24 mg/dL   Creatinine, Ser 1.03 (H) 0.57 - 1.00 mg/dL   eGFR 68 >59 mL/min/1.73   BUN/Creatinine Ratio 8  (L) 9 - 23   Sodium 143 134 - 144 mmol/L   Potassium 4.6 3.5 - 5.2 mmol/L   Chloride 106 96 - 106 mmol/L   CO2 21 20 - 29 mmol/L   Calcium 9.8 8.7 - 10.2 mg/dL   Total Protein 7.3 6.0 - 8.5 g/dL   Albumin 4.3 3.9 - 4.9 g/dL   Globulin, Total 3.0 1.5 - 4.5 g/dL   Albumin/Globulin Ratio 1.4 1.2 - 2.2   Bilirubin Total 0.4 0.0 - 1.2 mg/dL   Alkaline Phosphatase 74 44 - 121 IU/L   AST 14 0 - 40 IU/L   ALT 16 0 - 32 IU/L  Lipid Panel w/o Chol/HDL Ratio     Status: Abnormal   Collection Time: 03/17/22 11:32 AM  Result Value Ref Range   Cholesterol, Total 143 100 - 199 mg/dL   Triglycerides 91 0 - 149 mg/dL   HDL 33 (L) >39 mg/dL   VLDL Cholesterol Cal 17 5 - 40 mg/dL   LDL Chol Calc (NIH) 93 0 - 99 mg/dL  Hgb A1c w/o eAG     Status: Abnormal   Collection Time: 03/17/22 11:32 AM  Result Value Ref Range   Hgb A1c MFr Bld 5.9 (H) 4.8 - 5.6 %    Comment:          Prediabetes: 5.7 - 6.4          Diabetes: >6.4          Glycemic control for adults with diabetes: <7.0   TSH     Status: None   Collection Time: 03/17/22 11:32 AM  Result Value Ref Range   TSH 1.770 0.450 - 4.500 uIU/mL      Assessment & Plan:   Problem List Items Addressed This Visit       Other   Prediabetes   Low back pain   Relevant Orders   Ambulatory referral to Physical Therapy   Depression - Primary   Relevant Medications   escitalopram (LEXAPRO) 10 MG tablet    Return in about 3 months (around 06/24/2022).   Total time spent: 35 minutes  Evern Bio, NP  03/24/2022

## 2022-03-24 NOTE — Patient Instructions (Signed)
1) Reviewed fasting labs 2) PT referral for lower back pain 3) Follow up appt in 3 months, 2 weeks, fasting labs prior

## 2022-04-08 ENCOUNTER — Ambulatory Visit (INDEPENDENT_AMBULATORY_CARE_PROVIDER_SITE_OTHER): Payer: BLUE CROSS/BLUE SHIELD | Admitting: Family

## 2022-04-08 DIAGNOSIS — Z309 Encounter for contraceptive management, unspecified: Secondary | ICD-10-CM

## 2022-04-08 MED ORDER — MEDROXYPROGESTERONE ACETATE 150 MG/ML IM SUSY
150.0000 mg | PREFILLED_SYRINGE | INTRAMUSCULAR | Status: AC
  Administered 2022-04-08: 150 mg via INTRAMUSCULAR

## 2022-06-24 ENCOUNTER — Encounter: Payer: BLUE CROSS/BLUE SHIELD | Admitting: Nurse Practitioner

## 2022-06-30 ENCOUNTER — Other Ambulatory Visit: Payer: Self-pay | Admitting: Nurse Practitioner

## 2022-07-01 ENCOUNTER — Encounter: Payer: BLUE CROSS/BLUE SHIELD | Admitting: Nurse Practitioner

## 2022-07-01 ENCOUNTER — Ambulatory Visit: Payer: BLUE CROSS/BLUE SHIELD

## 2022-07-08 ENCOUNTER — Ambulatory Visit: Payer: BLUE CROSS/BLUE SHIELD

## 2022-07-11 ENCOUNTER — Ambulatory Visit (INDEPENDENT_AMBULATORY_CARE_PROVIDER_SITE_OTHER): Payer: BLUE CROSS/BLUE SHIELD | Admitting: Nurse Practitioner

## 2022-07-11 DIAGNOSIS — Z309 Encounter for contraceptive management, unspecified: Secondary | ICD-10-CM

## 2022-07-11 MED ORDER — MEDROXYPROGESTERONE ACETATE 150 MG/ML IM SUSY
150.0000 mg | PREFILLED_SYRINGE | INTRAMUSCULAR | Status: AC
Start: 2022-07-11 — End: 2022-07-11
  Administered 2022-07-11: 150 mg via INTRAMUSCULAR

## 2022-07-14 ENCOUNTER — Encounter: Payer: BLUE CROSS/BLUE SHIELD | Admitting: Nurse Practitioner

## 2022-08-01 ENCOUNTER — Encounter: Payer: BLUE CROSS/BLUE SHIELD | Admitting: Cardiology

## 2022-08-06 ENCOUNTER — Other Ambulatory Visit: Payer: Self-pay | Admitting: Nurse Practitioner

## 2022-08-22 ENCOUNTER — Encounter: Payer: BLUE CROSS/BLUE SHIELD | Admitting: Nurse Practitioner

## 2022-08-25 ENCOUNTER — Encounter: Payer: BLUE CROSS/BLUE SHIELD | Admitting: Family

## 2022-08-31 NOTE — Progress Notes (Signed)
   Subjective   CHIEF COMPLAINT  Depo Injection      REASON FOR VISIT  Depo-Provera Shot       Objective   No results found for any visits on 04/08/22.  Assessment & Plan  1. Encounter for contraceptive management, unspecified type Depo-provera given in office.  Repeat in 3 months.   - medroxyPROGESTERone Acetate SUSY 150 mg   Total time spent: 5 minutes  Miki Kins, FNP 04/08/2022

## 2022-09-05 ENCOUNTER — Ambulatory Visit (INDEPENDENT_AMBULATORY_CARE_PROVIDER_SITE_OTHER): Payer: BLUE CROSS/BLUE SHIELD | Admitting: Family

## 2022-09-05 DIAGNOSIS — R059 Cough, unspecified: Secondary | ICD-10-CM | POA: Diagnosis not present

## 2022-09-05 DIAGNOSIS — U071 COVID-19: Secondary | ICD-10-CM

## 2022-09-05 LAB — POCT XPERT XPRESS SARS COVID-2/FLU/RSV
FLU A: NEGATIVE
FLU B: NEGATIVE
RSV RNA, PCR: NEGATIVE
SARS Coronavirus 2: POSITIVE

## 2022-09-05 NOTE — Progress Notes (Signed)
   Subjective   CHIEF COMPLAINT  COVID Testing    REASON FOR VISIT  COVID testing for URI symptoms.        Objective   Results for orders placed or performed in visit on 09/05/22  POCT XPERT XPRESS SARS COVID-2/FLU/RSV [[id number]]  Result Value Ref Range   SARS Coronavirus 2 positive    FLU A neg    FLU B neg    RSV RNA, PCR neg     Assessment & Plan  1. Cough in adult COVID test positive for pt. In office today.  Patient notified. - POCT XPERT XPRESS SARS COVID-2/FLU/RSV [[id number]] 2. COVID-19  Total time spent: 5 minutes  Miki Kins, FNP 09/05/2022

## 2022-09-06 ENCOUNTER — Other Ambulatory Visit: Payer: Self-pay

## 2022-09-06 MED ORDER — PAXLOVID (300/100) 20 X 150 MG & 10 X 100MG PO TBPK: 3.0000 | ORAL_TABLET | Freq: Two times a day (BID) | ORAL | 0 refills | Status: AC

## 2022-09-26 ENCOUNTER — Other Ambulatory Visit: Payer: Self-pay | Admitting: Family

## 2022-10-10 ENCOUNTER — Ambulatory Visit: Payer: BLUE CROSS/BLUE SHIELD | Admitting: Internal Medicine

## 2022-10-10 ENCOUNTER — Encounter: Payer: Self-pay | Admitting: Internal Medicine

## 2022-10-10 DIAGNOSIS — Z3002 Counseling and instruction in natural family planning to avoid pregnancy: Secondary | ICD-10-CM

## 2022-10-10 MED ORDER — MEDROXYPROGESTERONE ACETATE 150 MG/ML IM SUSY
150.0000 mg | PREFILLED_SYRINGE | Freq: Once | INTRAMUSCULAR | Status: AC
Start: 2022-10-10 — End: 2022-10-10
  Administered 2022-10-10: 150 mg via INTRAMUSCULAR

## 2022-10-11 NOTE — Progress Notes (Signed)
Depo inj

## 2022-10-31 ENCOUNTER — Other Ambulatory Visit: Payer: Self-pay | Admitting: Family

## 2022-11-08 ENCOUNTER — Encounter: Payer: Self-pay | Admitting: Cardiology

## 2022-11-08 ENCOUNTER — Other Ambulatory Visit: Payer: Self-pay

## 2022-11-08 ENCOUNTER — Ambulatory Visit: Payer: BLUE CROSS/BLUE SHIELD | Admitting: Cardiology

## 2022-11-08 VITALS — BP 142/76 | HR 95 | Ht 63.0 in | Wt 224.0 lb

## 2022-11-08 DIAGNOSIS — Z1329 Encounter for screening for other suspected endocrine disorder: Secondary | ICD-10-CM | POA: Diagnosis not present

## 2022-11-08 DIAGNOSIS — R7303 Prediabetes: Secondary | ICD-10-CM | POA: Diagnosis not present

## 2022-11-08 DIAGNOSIS — F17210 Nicotine dependence, cigarettes, uncomplicated: Secondary | ICD-10-CM | POA: Diagnosis not present

## 2022-11-08 DIAGNOSIS — I10 Essential (primary) hypertension: Secondary | ICD-10-CM

## 2022-11-08 DIAGNOSIS — F32A Depression, unspecified: Secondary | ICD-10-CM | POA: Diagnosis not present

## 2022-11-08 DIAGNOSIS — L659 Nonscarring hair loss, unspecified: Secondary | ICD-10-CM

## 2022-11-08 DIAGNOSIS — Z13220 Encounter for screening for lipoid disorders: Secondary | ICD-10-CM

## 2022-11-08 MED ORDER — AMLODIPINE BESY-BENAZEPRIL HCL 10-20 MG PO CAPS: 1.0000 | ORAL_CAPSULE | Freq: Every day | ORAL | 0 refills | Status: DC

## 2022-11-08 MED ORDER — BUPROPION HCL ER (SR) 150 MG PO TB12
ORAL_TABLET | ORAL | 0 refills | Status: DC
Start: 2022-11-08 — End: 2023-07-06

## 2022-11-08 MED ORDER — ESCITALOPRAM OXALATE 10 MG PO TABS: 10.0000 mg | ORAL_TABLET | Freq: Every day | ORAL | 5 refills | Status: DC

## 2022-11-08 NOTE — Progress Notes (Signed)
Established Patient Office Visit  Subjective:  Patient ID: Gwendolyn Grant, female    DOB: February 19, 1975  Age: 47 y.o. MRN: [id number]  Chief Complaint  Patient presents with   Follow-up    Medication Refills    Patient in office for medication refills. Patient reports feeling well, no complaints today. Patient requesting a referral to dermatology for alopecia. Referral sent.  Patient wanting to quit smoking, discussed options. Patient would like to try Wellbutrin.  Return for fasting lab work.     No other concerns at this time.   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     Social History   Socioeconomic History   Marital status: Married    Spouse name: Not on file   Number of children: Not on file   Years of education: Not on file   Highest education level: Not on file  Occupational History   Not on file  Tobacco Use   Smoking status: Every Day    Types: Cigarettes   Smokeless tobacco: Never  Vaping Use   Vaping status: Not on file  Substance and Sexual Activity   Alcohol use: Not Currently   Drug use: Yes    Frequency: 1.0 times per week    Types: Marijuana   Sexual activity: Not on file  Other Topics Concern   Not on file  Social History Narrative   Not on file   Social Determinants of Health   Financial Resource Strain: Not on file  Food Insecurity: Not on file  Transportation Needs: Not on file  Physical Activity: Not on file  Stress: Not on file  Social Connections: Not on file  Intimate Partner Violence: Not on file    History  reviewed. No pertinent family history.  Allergies  Allergen Reactions   Azithromycin Itching   Morphine Other (See Comments)    Review of Systems  Constitutional: Negative.   HENT: Negative.    Eyes: Negative.   Respiratory: Negative.  Negative for shortness of breath.   Cardiovascular: Negative.  Negative for chest pain.  Gastrointestinal: Negative.  Negative for abdominal pain, constipation and diarrhea.  Genitourinary: Negative.   Musculoskeletal:  Negative for joint pain and myalgias.  Skin: Negative.   Neurological: Negative.  Negative for dizziness and headaches.  Endo/Heme/Allergies: Negative.   All other systems reviewed and are negative.      Objective:   BP (!) 142/76   Pulse 95   Ht 5\' 3"  (1.6 m)   Wt 224 lb (101.6 kg)   SpO2 99%   BMI 39.68 kg/m   Vitals:   11/08/22 1531  BP: (!) 142/76  Pulse: 95  Height: 5\' 3"  (1.6 m)  Weight: 224 lb (101.6 kg)  SpO2: 99%  BMI (Calculated): 39.69    Physical Exam Vitals and nursing note reviewed.  Constitutional:      Appearance: Normal appearance. She is normal weight.  HENT:     Head: Normocephalic and atraumatic.     Nose: Nose normal.     Mouth/Throat:     Mouth: Mucous membranes  are moist.  Eyes:     Extraocular Movements: Extraocular movements intact.     Conjunctiva/sclera: Conjunctivae normal.     Pupils: Pupils are equal, round, and reactive to light.  Cardiovascular:     Rate and Rhythm: Normal rate and regular rhythm.     Pulses: Normal pulses.     Heart sounds: Normal heart sounds.  Pulmonary:     Effort: Pulmonary effort is normal.     Breath sounds: Normal breath sounds.  Abdominal:     General: Abdomen is flat. Bowel sounds are normal.     Palpations: Abdomen is soft.  Musculoskeletal:        General: Normal range of motion.     Cervical back: Normal range of motion.  Skin:    General: Skin is warm and dry.  Neurological:     General: No focal deficit present.     Mental Status:  She is alert and oriented to person, place, and time.  Psychiatric:        Mood and Affect: Mood normal.        Behavior: Behavior normal.        Thought Content: Thought content normal.        Judgment: Judgment normal.      No results found for any visits on 11/08/22.  Recent Results (from the past 2160 hour(s))  POCT XPERT XPRESS SARS COVID-2/FLU/RSV [[id number]]     Status: None   Collection Time: 09/05/22  3:38 PM  Result Value Ref Range   SARS Coronavirus 2 positive    FLU A neg    FLU B neg    RSV RNA, PCR neg       Assessment & Plan:  Return for fasting lab work. Referral sent to dermatology. Wellbutrin for smoking cessation.   Problem List Items Addressed This Visit       Cardiovascular and Mediastinum   Essential hypertension - Primary   Relevant Orders   CMP14+EGFR     Other   Prediabetes   Relevant Orders   Hemoglobin A1c   CMP14+EGFR   Depression   Relevant Medications   escitalopram (LEXAPRO) 10 MG tablet   Other Visit Diagnoses     Thyroid disorder screening       Relevant Orders   TSH   Lipid screening       Relevant Orders   Lipid panel       Return in about 4 weeks (around 12/06/2022).   Total time spent: 25 minutes  [employer], NP  11/08/2022   This document may have been prepared by Dragon Voice Recognition software and as such may include unintentional dictation errors.

## 2023-02-11 ENCOUNTER — Other Ambulatory Visit: Payer: Self-pay | Admitting: Cardiology

## 2023-02-13 ENCOUNTER — Emergency Department: Payer: BLUE CROSS/BLUE SHIELD

## 2023-02-13 ENCOUNTER — Ambulatory Visit (INDEPENDENT_AMBULATORY_CARE_PROVIDER_SITE_OTHER): Payer: BLUE CROSS/BLUE SHIELD | Admitting: Cardiology

## 2023-02-13 ENCOUNTER — Emergency Department
Admission: EM | Admit: 2023-02-13 | Discharge: 2023-02-13 | Disposition: A | Discharge status: Home / Self Care | Payer: BLUE CROSS/BLUE SHIELD | Attending: Emergency Medicine | Admitting: Emergency Medicine

## 2023-02-13 ENCOUNTER — Other Ambulatory Visit: Payer: Self-pay | Admitting: Internal Medicine

## 2023-02-13 ENCOUNTER — Other Ambulatory Visit: Payer: Self-pay

## 2023-02-13 DIAGNOSIS — J09X2 Influenza due to identified novel influenza A virus with other respiratory manifestations: Secondary | ICD-10-CM

## 2023-02-13 DIAGNOSIS — Z20822 Contact with and (suspected) exposure to covid-19: Secondary | ICD-10-CM | POA: Diagnosis not present

## 2023-02-13 DIAGNOSIS — R6889 Other general symptoms and signs: Secondary | ICD-10-CM

## 2023-02-13 DIAGNOSIS — J069 Acute upper respiratory infection, unspecified: Secondary | ICD-10-CM | POA: Diagnosis not present

## 2023-02-13 DIAGNOSIS — J101 Influenza due to other identified influenza virus with other respiratory manifestations: Secondary | ICD-10-CM | POA: Diagnosis not present

## 2023-02-13 DIAGNOSIS — R0602 Shortness of breath: Secondary | ICD-10-CM

## 2023-02-13 DIAGNOSIS — R051 Acute cough: Secondary | ICD-10-CM

## 2023-02-13 DIAGNOSIS — J4520 Mild intermittent asthma, uncomplicated: Secondary | ICD-10-CM

## 2023-02-13 LAB — BASIC METABOLIC PANEL
Anion gap: 11 (ref 5–15)
BUN: 9 mg/dL (ref 6–20)
CO2: 25 mmol/L (ref 22–32)
Calcium: 8.9 mg/dL (ref 8.9–10.3)
Chloride: 103 mmol/L (ref 98–111)
Creatinine, Ser: 1.04 mg/dL — ABNORMAL HIGH (ref 0.44–1.00)
GFR, Estimated: >60 mL/min (ref >60)
Glucose, Bld: 140 mg/dL — ABNORMAL HIGH (ref 70–99)
Potassium: 3.9 mmol/L (ref 3.5–5.1)
Sodium: 139 mmol/L (ref 135–145)

## 2023-02-13 LAB — CBC
HCT: 38.3 % (ref 36.0–46.0)
Hemoglobin: 12.9 g/dL (ref 12.0–15.0)
MCH: 31.4 pg (ref 26.0–34.0)
MCHC: 33.7 g/dL (ref 30.0–36.0)
MCV: 93.2 fL (ref 80.0–100.0)
Platelets: 250 10*3/uL (ref 150–400)
RBC: 4.11 MIL/uL (ref 3.87–5.11)
RDW: 13.7 % (ref 11.5–15.5)
WBC: 9.2 10*3/uL (ref 4.0–10.5)
nRBC: 0 % (ref 0.0–0.2)

## 2023-02-13 LAB — RESP PANEL BY RT-PCR (RSV, FLU A&B, COVID)  RVPGX2
Influenza A by PCR: POSITIVE — AB
Influenza B by PCR: NEGATIVE
Resp Syncytial Virus by PCR: NEGATIVE
SARS Coronavirus 2 by RT PCR: NEGATIVE

## 2023-02-13 LAB — POCT XPERT XPRESS SARS COVID-2/FLU/RSV
FLU A: POSITIVE
FLU B: NEGATIVE
RSV RNA, PCR: NEGATIVE
SARS Coronavirus 2: NEGATIVE

## 2023-02-13 LAB — TROPONIN I (HIGH SENSITIVITY): Troponin I (High Sensitivity): 13 ng/L (ref <18)

## 2023-02-13 MED ORDER — ALBUTEROL SULFATE HFA 108 (90 BASE) MCG/ACT IN AERS: 2.0000 | INHALATION_SPRAY | Freq: Four times a day (QID) | RESPIRATORY_TRACT | 2 refills | Status: DC | PRN

## 2023-02-13 MED ORDER — AMLODIPINE BESY-BENAZEPRIL HCL 10-20 MG PO CAPS: 1.0000 | ORAL_CAPSULE | Freq: Every day | ORAL | 0 refills | Status: DC

## 2023-02-13 MED ORDER — OSELTAMIVIR PHOSPHATE 75 MG PO CAPS: 75.0000 mg | ORAL_CAPSULE | Freq: Two times a day (BID) | ORAL | 0 refills | Status: AC

## 2023-02-13 NOTE — ED Triage Notes (Addendum)
Pt sts that she saw her PCP at 0930 today and was tested for COVID and Flu. Pt sts that she has been SOB for the last two days and has not gotten any results from them so she decided to come in and be seen. Pt advised that she was around her granddaughter on Friday who tested positive for the flu.

## 2023-02-13 NOTE — ED Provider Notes (Signed)
[facility] Provider Note    Event Date/Time   First MD Initiated Contact with Patient 02/13/23 1516     (approximate)   History   Shortness of Breath   HPI  Gwendolyn Grant is a 48 y.o. female with a history of smoking who presents with complaints of bodyaches, cough, fatigue, headache, mild shortness of breath.  Went to her PCP this morning, she reports they just called and told her that her flu test came back positive.  She reports family member had flu recently.     Physical Exam   Triage Vital Signs: ED Triage Vitals  Encounter Vitals Group     BP 02/13/23 1103 (!) 177/90     Systolic BP Percentile --      Diastolic BP Percentile --      Pulse Rate 02/13/23 1103 90     Resp 02/13/23 1103 18     Temp 02/13/23 1103 (!) 100.5 F (38.1 C)     Temp Source 02/13/23 1103 Oral     SpO2 02/13/23 1103 100 %     Weight 02/13/23 1100 89.8 kg (198 lb)     Height 02/13/23 1100 1.6 m (5\' 3" )     Head Circumference --      Peak Flow --      Pain Score 02/13/23 1100 8     Pain Loc --      Pain Education --      Exclude from Growth Chart --     Most recent vital signs: Vitals:   02/13/23 1103  BP: (!) 177/90  Pulse: 90  Resp: 18  Temp: (!) 100.5 F (38.1 C)  SpO2: 100%     General: Awake, no distress.  CV:  Good peripheral perfusion.  Resp:  Normal effort.  Clear to auscultation bilaterally Abd:  No distention.  Other:  No lower extremity edema   ED Results / Procedures / Treatments   Labs (all labs ordered are listed, but only abnormal results are displayed) Labs Reviewed  RESP PANEL BY RT-PCR (RSV, FLU A&B, COVID)  RVPGX2 - Abnormal; Notable for the following components:      Result Value   Influenza A by PCR POSITIVE (*)    All other components within normal limits  BASIC METABOLIC PANEL - Abnormal; Notable for the following components:   Glucose, Bld 140 (*)    Creatinine, Ser 1.04 (*)    All other components within normal limits   CBC  POC URINE PREG, ED  TROPONIN I (HIGH SENSITIVITY)  TROPONIN I (HIGH SENSITIVITY)     EKG     RADIOLOGY Chest x-ray viewed interpret by me, no pneumonia    PROCEDURES:  Critical Care performed:   Procedures   MEDICATIONS ORDERED IN ED: Medications - No data to display   IMPRESSION / MDM / ASSESSMENT AND PLAN / ED COURSE  I reviewed the triage vital signs and the nursing notes. Patient's presentation is most consistent with acute presentation with potential threat to life or bodily function.  Patient presents with symptoms as above, suspicious for viral illness, possibility of superimposed bacterial pneumonia  Influenza A PCR positive, chest x-ray negative for pneumonia, lab work quite reassuring.  No wheezing on exam  Supportive care recommended, PCP prescribed Tamiflu, appropriate for outpatient follow-up, return precautions discussed        FINAL CLINICAL IMPRESSION(S) / ED DIAGNOSES   Final diagnoses:  Influenza A     Rx / DC Orders  ED Discharge Orders     None        Note:  This document was prepared using Dragon voice recognition software and may include unintentional dictation errors.   Jene Every, MD 02/13/23 [phone]

## 2023-02-14 ENCOUNTER — Other Ambulatory Visit: Payer: Self-pay | Admitting: Cardiology

## 2023-03-11 ENCOUNTER — Other Ambulatory Visit: Payer: Self-pay | Admitting: Family

## 2023-05-10 ENCOUNTER — Other Ambulatory Visit: Payer: Self-pay | Admitting: Cardiology

## 2023-05-17 ENCOUNTER — Other Ambulatory Visit: Payer: Self-pay | Admitting: Cardiology

## 2023-06-19 ENCOUNTER — Encounter: Payer: Self-pay | Admitting: Cardiology

## 2023-06-19 ENCOUNTER — Ambulatory Visit: Payer: Self-pay | Admitting: Cardiology

## 2023-06-19 ENCOUNTER — Ambulatory Visit

## 2023-06-19 DIAGNOSIS — R6889 Other general symptoms and signs: Secondary | ICD-10-CM

## 2023-06-19 DIAGNOSIS — J069 Acute upper respiratory infection, unspecified: Secondary | ICD-10-CM

## 2023-06-19 LAB — POCT XPERT XPRESS SARS COVID-2/FLU/RSV
FLU A: NEGATIVE
FLU B: NEGATIVE
RSV RNA, PCR: NEGATIVE
SARS Coronavirus 2: NEGATIVE

## 2023-06-19 NOTE — Progress Notes (Signed)
 Notified via mychart.

## 2023-06-20 ENCOUNTER — Encounter: Payer: Self-pay | Admitting: Cardiology

## 2023-06-20 ENCOUNTER — Ambulatory Visit: Admitting: Cardiology

## 2023-06-20 VITALS — BP 162/95 | HR 101 | Ht 63.0 in | Wt 229.8 lb

## 2023-06-20 DIAGNOSIS — J069 Acute upper respiratory infection, unspecified: Secondary | ICD-10-CM

## 2023-06-20 DIAGNOSIS — I10 Essential (primary) hypertension: Secondary | ICD-10-CM

## 2023-06-20 HISTORY — DX: Acute upper respiratory infection, unspecified: J06.9

## 2023-06-20 MED ORDER — AMOXICILLIN-POT CLAVULANATE 875-125 MG PO TABS
1.0000 | ORAL_TABLET | Freq: Two times a day (BID) | ORAL | 0 refills | Status: AC
Start: 2023-06-20 — End: 2023-06-27

## 2023-06-20 MED ORDER — FLUTICASONE PROPIONATE 50 MCG/ACT NA SUSP
1.0000 | Freq: Every day | NASAL | 2 refills | Status: DC
Start: 2023-06-20 — End: 2023-12-04

## 2023-06-20 NOTE — Progress Notes (Signed)
 Established Patient Office Visit  Subjective:  Patient ID: Gwendolyn Grant, female    DOB: 08/04/1975  Age: 48 y.o. MRN: [id number]  Chief Complaint  Patient presents with   Acute Visit    Chest congestion, cough, body aches, sore throat    Patient in office for an acute visit, complaining of chest congestion, cough, body aches and sore throat. Patient tested negative for Flu/RSV/Covid yesterday in office. Patient complaining of congestion, cough, diarrhea, headaches, nausea, runny nose, PND, sneezing, yellow sputum and sore throat. Denies sinus pain, ear pain, vomiting. Patient tried Theraflu and Robitussin at home with no relief. Will send in Augmentin, Flonase. Recommend Mucinex-D, increase water intake.   URI  This is a new problem. The current episode started in the past 7 days. The problem has been unchanged. There has been no fever. Associated symptoms include congestion, coughing, diarrhea, headaches, nausea, rhinorrhea, sneezing and a sore throat. Pertinent negatives include no abdominal pain, chest pain, ear pain, joint pain, plugged ear sensation, sinus pain or vomiting. Treatments tried: Theraflu, Robitussin. The treatment provided no relief.    No other concerns at this time.   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     Social History   Socioeconomic History   Marital status: Married    Spouse name: Not on file   Number of children: Not on file   Years of education: Not on file   Highest education level: Not on file  Occupational History   Not on file   Tobacco Use   Smoking status: Every Day    Types: Cigarettes   Smokeless tobacco: Never  Vaping Use   Vaping status: Not on file  Substance and Sexual Activity   Alcohol use: Not Currently   Drug use: Yes    Frequency: 1.0 times per week    Types: Marijuana   Sexual activity: Not on file  Other Topics Concern   Not on file  Social History Narrative   Not on file   Social Drivers of Health   Financial Resource Strain: Low Risk  (03/21/2023)   Received from [facility]   Overall Financial Resource Strain (CARDIA)    Difficulty of Paying Living Expenses: Not very hard  Food Insecurity: No Food Insecurity (03/21/2023)   Received from [facility]   Hunger Vital Sign    Worried About Running Out of Food in the Last Year: Never true    Ran Out of Food in the Last Year: Never true  Transportation Needs: No Transportation Needs (03/21/2023)   Received from [facility] - Transportation    In the past 12 months, has lack of transportation kept you from medical appointments or from getting medications?: No    Lack of Transportation (Non-Medical): No  Physical Activity: Not on file  Stress: Not on file  Social Connections: Not on file  Intimate Partner Violence: Not on file    History reviewed. No pertinent family history.  Allergies  Allergen Reactions   Azithromycin Itching   Morphine  Other (See Comments)    Outpatient Medications Prior to Visit  Medication Sig   albuterol  (VENTOLIN  HFA) 108 (90 Base) MCG/ACT inhaler Inhale 2 puffs into the lungs every 6 (six) hours as needed for wheezing or shortness of breath.   amLODipine -benazepril  (LOTREL) 10-20 MG capsule Take 1 capsule by mouth once daily   clobetasol ointment (TEMOVATE) 0.05 % Apply topically 2 (two) times daily.   hydrOXYzine (ATARAX) 10 MG tablet TAKE 1 TABLET BY MOUTH ONCE DAILY AS NEEDED FOR MOMENTS OF HIGH ANXIETY   OPZELURA 1.5 % CREA    ALPRAZolam  (XANAX) 0.5 MG tablet Take by mouth. (Patient not taking: Reported on 06/20/2023)   buPROPion  (WELLBUTRIN  SR) 150 MG 12 hr tablet Take 150 mg by mouth once daily for 3 days, after three days, increase to twice daily. Separate doses by at least 8 hours, last dose no later than 6pm. (Patient not taking: Reported on 06/20/2023)   escitalopram  (LEXAPRO ) 10 MG tablet Take 1 tablet (10 mg total) by mouth daily. (Patient not taking: Reported on 06/20/2023)   medroxyPROGESTERone  Acetate 150 MG/ML SUSY BRING TO OFFICE AND NURSE WILL INJECT INTO THE MUSCLE ONCE EVERY 3 MONTHS (Patient not taking: Reported on 06/20/2023)   No facility-administered medications prior to visit.    Review of Systems  Constitutional: Negative.   HENT:  Positive for congestion, rhinorrhea, sneezing and sore throat. Negative for ear discharge, ear pain and sinus pain.   Eyes: Negative.   Respiratory:  Positive for cough and sputum production. Negative for shortness of breath.   Cardiovascular: Negative.  Negative for chest pain.  Gastrointestinal:  Positive for diarrhea and nausea. Negative for abdominal pain, constipation and vomiting.  Genitourinary: Negative.   Musculoskeletal:  Negative for joint pain and myalgias.  Skin: Negative.   Neurological:  Positive for headaches. Negative for dizziness.  Endo/Heme/Allergies: Negative.   All other systems reviewed and are negative.      Objective:   BP (!) 162/95   Pulse (!) 101   Ht 5\' 3"  (1.6 m)   Wt 229 lb 12.8 oz (104.2 kg)   SpO2 97%   BMI 40.71 kg/m   Vitals:   06/20/23 0910  BP: (!) 162/95  Pulse: (!) 101  Height: 5\' 3"  (1.6 m)  Weight: 229 lb 12.8 oz (104.2 kg)  SpO2: 97%  BMI (Calculated): 40.72    Physical Exam Vitals and nursing note reviewed.  Constitutional:      Appearance: Normal appearance. She is normal weight.  HENT:     Head: Normocephalic and atraumatic.     Nose: Nose normal.     Mouth/Throat:     Mouth: Mucous membranes are moist.  Eyes:      Extraocular Movements: Extraocular movements intact.     Conjunctiva/sclera: Conjunctivae normal.     Pupils: Pupils are equal, round, and reactive to light.  Cardiovascular:     Rate and Rhythm: Normal rate and regular rhythm.     Pulses: Normal pulses.     Heart sounds: Normal heart sounds.  Pulmonary:     Effort: Pulmonary effort is normal.     Breath sounds: Normal breath sounds.  Abdominal:     General: Abdomen is flat. Bowel sounds are normal.     Palpations: Abdomen is soft.  Musculoskeletal:        General: Normal range of motion.     Cervical back: Normal range of motion.  Skin:    General: Skin is warm and dry.  Neurological:     General: No focal deficit present.     Mental Status: She is alert and oriented to person, place, and time.  Psychiatric:        Mood and Affect: Mood normal.        Behavior: Behavior normal.        Thought Content: Thought content normal.        Judgment: Judgment normal.      No results found for any visits on 06/20/23.  Recent Results (from the past 2160 hours)  POCT XPERT XPRESS SARS COVID-2/FLU/RSV [[id number]]     Status: Normal   Collection Time: 06/19/23 11:18 AM  Result Value Ref Range   SARS Coronavirus 2 Negative    FLU A Negative    FLU B Negative    RSV RNA, PCR Negative       Assessment & Plan:  Augmentin Flonase Mucinex-D Increase water intake  Problem List Items Addressed This Visit       Respiratory   Acute URI - Primary    Return if symptoms worsen or fail to improve.   Total time spent: 25 minutes  Google, NP  06/20/2023   This document may have been prepared by Dragon Voice Recognition software and as such may include unintentional dictation errors.

## 2023-06-30 ENCOUNTER — Ambulatory Visit: Admitting: Cardiology

## 2023-07-04 ENCOUNTER — Other Ambulatory Visit

## 2023-07-04 DIAGNOSIS — Z1329 Encounter for screening for other suspected endocrine disorder: Secondary | ICD-10-CM

## 2023-07-04 DIAGNOSIS — I10 Essential (primary) hypertension: Secondary | ICD-10-CM

## 2023-07-04 DIAGNOSIS — Z13220 Encounter for screening for lipoid disorders: Secondary | ICD-10-CM

## 2023-07-04 DIAGNOSIS — R7303 Prediabetes: Secondary | ICD-10-CM

## 2023-07-05 ENCOUNTER — Ambulatory Visit: Payer: Self-pay | Admitting: Cardiology

## 2023-07-05 LAB — CMP14+EGFR
ALT: 26 IU/L (ref 0–32)
AST: 20 IU/L (ref 0–40)
Albumin: 4.2 g/dL (ref 3.9–4.9)
Alkaline Phosphatase: 90 IU/L (ref 44–121)
BUN/Creatinine Ratio: 10 (ref 9–23)
BUN: 11 mg/dL (ref 6–24)
Bilirubin Total: 0.4 mg/dL (ref 0.0–1.2)
CO2: 20 mmol/L (ref 20–29)
Calcium: 9.9 mg/dL (ref 8.7–10.2)
Chloride: 106 mmol/L (ref 96–106)
Creatinine, Ser: 1.05 mg/dL — ABNORMAL HIGH (ref 0.57–1.00)
Globulin, Total: 3.4 g/dL (ref 1.5–4.5)
Glucose: 113 mg/dL — ABNORMAL HIGH (ref 70–99)
Potassium: 4.9 mmol/L (ref 3.5–5.2)
Sodium: 141 mmol/L (ref 134–144)
Total Protein: 7.6 g/dL (ref 6.0–8.5)
eGFR: 66 mL/min/{1.73_m2} (ref >59)

## 2023-07-05 LAB — LIPID PANEL
Chol/HDL Ratio: 4.7 ratio — ABNORMAL HIGH (ref 0.0–4.4)
Cholesterol, Total: 145 mg/dL (ref 100–199)
HDL: 31 mg/dL — ABNORMAL LOW (ref >39)
LDL Chol Calc (NIH): 92 mg/dL (ref 0–99)
Triglycerides: 122 mg/dL (ref 0–149)
VLDL Cholesterol Cal: 22 mg/dL (ref 5–40)

## 2023-07-05 LAB — HEMOGLOBIN A1C
Est. average glucose Bld gHb Est-mCnc: 128 mg/dL
Hgb A1c MFr Bld: 6.1 % — ABNORMAL HIGH (ref 4.8–5.6)

## 2023-07-05 LAB — TSH: TSH: 2.66 u[IU]/mL (ref 0.450–4.500)

## 2023-07-06 ENCOUNTER — Encounter: Payer: Self-pay | Admitting: Cardiology

## 2023-07-06 ENCOUNTER — Ambulatory Visit: Admitting: Cardiology

## 2023-07-06 VITALS — BP 139/89 | HR 100 | Ht 63.0 in | Wt 226.4 lb

## 2023-07-06 DIAGNOSIS — J101 Influenza due to other identified influenza virus with other respiratory manifestations: Secondary | ICD-10-CM

## 2023-07-06 DIAGNOSIS — I10 Essential (primary) hypertension: Secondary | ICD-10-CM | POA: Diagnosis not present

## 2023-07-06 DIAGNOSIS — Z1211 Encounter for screening for malignant neoplasm of colon: Secondary | ICD-10-CM

## 2023-07-06 DIAGNOSIS — Z1231 Encounter for screening mammogram for malignant neoplasm of breast: Secondary | ICD-10-CM | POA: Diagnosis not present

## 2023-07-06 DIAGNOSIS — J4520 Mild intermittent asthma, uncomplicated: Secondary | ICD-10-CM

## 2023-07-06 DIAGNOSIS — G4730 Sleep apnea, unspecified: Secondary | ICD-10-CM

## 2023-07-06 DIAGNOSIS — E559 Vitamin D deficiency, unspecified: Secondary | ICD-10-CM

## 2023-07-06 DIAGNOSIS — R7303 Prediabetes: Secondary | ICD-10-CM | POA: Diagnosis not present

## 2023-07-06 DIAGNOSIS — L930 Discoid lupus erythematosus: Secondary | ICD-10-CM | POA: Insufficient documentation

## 2023-07-06 MED ORDER — ZEPBOUND 2.5 MG/0.5ML ~~LOC~~ SOAJ: 2.5000 mg | SUBCUTANEOUS | 4 refills | Status: DC

## 2023-07-06 MED ORDER — HYDROXYZINE HCL 10 MG PO TABS: 10.0000 mg | ORAL_TABLET | Freq: Three times a day (TID) | ORAL | 0 refills | Status: DC | PRN

## 2023-07-06 MED ORDER — ALBUTEROL SULFATE HFA 108 (90 BASE) MCG/ACT IN AERS: 2.0000 | INHALATION_SPRAY | Freq: Four times a day (QID) | RESPIRATORY_TRACT | 2 refills | Status: AC | PRN

## 2023-07-06 NOTE — Progress Notes (Addendum)
 Established Patient Office Visit  Subjective:  Patient ID: Gwendolyn Grant, female    DOB: December 26, 1975  Age: 48 y.o. MRN: [id number]  Chief Complaint  Patient presents with   Follow-up    Patient in office for regular follow up, discuss recent lab work. Patient continues to complains of sore throat, cough and ear pain. Patient treated for a URI on 06/20/2023. Recommend Mucinex D. Discussed recent lab work, Hgb A1c increased. Follow a strict diabetic diet.  Due for colonoscopy, referral sent. Due for mammogram, order placed.  Patient has a history of sleep apnea, no recent testing, will send referral for at home sleep study. Patient interested in a GLP1 medication. Will send in Zepbound  due to sleep apnea.    No other concerns at this time.   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     Social History   Socioeconomic History   Marital status: Married    Spouse name: Not on file   Number of children: Not on file   Years of education: Not on file   Highest education level: Not on file  Occupational History   Not on file  Tobacco Use   Smoking status: Every Day    Types: Cigarettes   Smokeless tobacco: Never  Vaping Use   Vaping status: Not on file  Substance and Sexual Activity   Alcohol use: Not Currently   Drug use: Yes    Frequency: 1.0 times per week    Types: Marijuana   Sexual activity: Not on file  Other Topics Concern   Not on file  Social History Narrative   Not on file   Social Drivers of Health   Financial  Resource Strain: Low Risk  (03/21/2023)   Received from [facility]   Overall Financial Resource Strain (CARDIA)    Difficulty of Paying Living Expenses: Not very hard  Food Insecurity: No Food Insecurity (03/21/2023)   Received from [facility]   Hunger Vital Sign    Within the past 12 months, you worried that your food would run out before you got the money to buy more.: Never true    Within the past 12 months, the food you bought just didn't last and you didn't have money to get more.: Never true  Transportation Needs: No Transportation Needs (03/21/2023)   Received from [facility] - Transportation    In the past 12 months, has lack of transportation kept you from medical appointments or from getting medications?: No    Lack of Transportation (Non-Medical): No  Physical Activity: Not on file  Stress: Not on file  Social Connections: Not on file  Intimate Partner Violence: Not on file    History reviewed. No pertinent family history.  Allergies  Allergen Reactions   Azithromycin Itching   Morphine Other (See Comments)    Outpatient Medications Prior to Visit  Medication Sig   clobetasol ointment (TEMOVATE) 0.05 % Apply topically 2 (two) times daily.   fluticasone  (FLONASE ) 50  MCG/ACT nasal spray Place 1 spray into both nostrils daily.   OPZELURA 1.5 % CREA    [DISCONTINUED] albuterol  (VENTOLIN  HFA) 108 (90 Base) MCG/ACT inhaler Inhale 2 puffs into the lungs every 6 (six) hours as needed for wheezing or shortness of breath.   [DISCONTINUED] amLODipine -benazepril  (LOTREL) 10-20 MG capsule Take 1 capsule by mouth once daily   [DISCONTINUED] hydrOXYzine  (ATARAX ) 10 MG tablet TAKE 1 TABLET BY MOUTH ONCE DAILY AS NEEDED FOR MOMENTS OF HIGH ANXIETY   [DISCONTINUED] ALPRAZolam (XANAX) 0.5 MG tablet Take by mouth. (Patient not taking: Reported on 07/06/2023)   [DISCONTINUED] buPROPion  (WELLBUTRIN  SR) 150 MG 12 hr tablet Take 150  mg by mouth once daily for 3 days, after three days, increase to twice daily. Separate doses by at least 8 hours, last dose no later than 6pm. (Patient not taking: Reported on 07/06/2023)   [DISCONTINUED] escitalopram  (LEXAPRO ) 10 MG tablet Take 1 tablet (10 mg total) by mouth daily. (Patient not taking: Reported on 07/06/2023)   [DISCONTINUED] medroxyPROGESTERone  Acetate 150 MG/ML SUSY BRING TO OFFICE AND NURSE WILL INJECT INTO THE MUSCLE ONCE EVERY 3 MONTHS (Patient not taking: Reported on 07/06/2023)   No facility-administered medications prior to visit.    Review of Systems  Constitutional: Negative.   HENT:  Positive for ear pain and sore throat.   Eyes: Negative.   Respiratory:  Positive for cough. Negative for shortness of breath.   Cardiovascular: Negative.  Negative for chest pain.  Gastrointestinal: Negative.  Negative for abdominal pain, constipation and diarrhea.  Genitourinary: Negative.   Musculoskeletal:  Negative for joint pain and myalgias.  Skin: Negative.   Neurological: Negative.  Negative for dizziness and headaches.  Endo/Heme/Allergies: Negative.   All other systems reviewed and are negative.      Objective:   BP 139/89   Pulse 100   Ht 5' 3 (1.6 m)   Wt 226 lb 6.4 oz (102.7 kg)   SpO2 99%   BMI 40.10 kg/m   Vitals:   07/06/23 1314 07/06/23 1334  BP: 139/89   Pulse: (!) 112 100  Height: 5' 3 (1.6 m)   Weight: 226 lb 6.4 oz (102.7 kg)   SpO2: 99%   BMI (Calculated): 40.11     Physical Exam Vitals and nursing note reviewed.  Constitutional:      Appearance: Normal appearance. She is normal weight.  HENT:     Head: Normocephalic and atraumatic.     Nose: Nose normal.     Mouth/Throat:     Mouth: Mucous membranes are moist.  Eyes:     Extraocular Movements: Extraocular movements intact.     Conjunctiva/sclera: Conjunctivae normal.     Pupils: Pupils are equal, round, and reactive to light.  Cardiovascular:     Rate and Rhythm: Normal rate  and regular rhythm.     Pulses: Normal pulses.     Heart sounds: Normal heart sounds.  Pulmonary:     Effort: Pulmonary effort is normal.     Breath sounds: Normal breath sounds.  Abdominal:     General: Abdomen is flat. Bowel sounds are normal.     Palpations: Abdomen is soft.  Musculoskeletal:        General: Normal range of motion.     Cervical back: Normal range of motion.  Skin:    General: Skin is warm and dry.  Neurological:     General: No focal deficit present.     Mental Status: She is alert and oriented to  person, place, and time.  Psychiatric:        Mood and Affect: Mood normal.        Behavior: Behavior normal.        Thought Content: Thought content normal.        Judgment: Judgment normal.      No results found for any visits on 07/06/23.  Recent Results (from the past 2160 hours)  POCT XPERT XPRESS SARS COVID-2/FLU/RSV [[id number]]     Status: Normal   Collection Time: 06/19/23 11:18 AM  Result Value Ref Range   SARS Coronavirus 2 Negative    FLU A Negative    FLU B Negative    RSV RNA, PCR Negative   CMP14+EGFR     Status: Abnormal   Collection Time: 07/04/23  8:13 AM  Result Value Ref Range   Glucose 113 (H) 70 - 99 mg/dL   BUN 11 6 - 24 mg/dL   Creatinine, Ser 8.94 (H) 0.57 - 1.00 mg/dL   eGFR 66 >40 fO/fpw/8.26   BUN/Creatinine Ratio 10 9 - 23   Sodium 141 134 - 144 mmol/L   Potassium 4.9 3.5 - 5.2 mmol/L   Chloride 106 96 - 106 mmol/L   CO2 20 20 - 29 mmol/L   Calcium 9.9 8.7 - 10.2 mg/dL   Total Protein 7.6 6.0 - 8.5 g/dL   Albumin 4.2 3.9 - 4.9 g/dL   Globulin, Total 3.4 1.5 - 4.5 g/dL   Bilirubin Total 0.4 0.0 - 1.2 mg/dL   Alkaline Phosphatase 90 44 - 121 IU/L   AST 20 0 - 40 IU/L   ALT 26 0 - 32 IU/L  Lipid panel     Status: Abnormal   Collection Time: 07/04/23  8:13 AM  Result Value Ref Range   Cholesterol, Total 145 100 - 199 mg/dL   Triglycerides 877 0 - 149 mg/dL   HDL 31 (L) >60 mg/dL   VLDL Cholesterol Cal 22 5 - 40 mg/dL    LDL Chol Calc (NIH) 92 0 - 99 mg/dL   Chol/HDL Ratio 4.7 (H) 0.0 - 4.4 ratio    Comment:                                   T. Chol/HDL Ratio                                             Men  Women                               1/2 Avg.Risk  3.4    3.3                                   Avg.Risk  5.0    4.4                                2X Avg.Risk  9.6    7.1                                3X  Avg.Risk 23.4   11.0   Hemoglobin A1c     Status: Abnormal   Collection Time: 07/04/23  8:13 AM  Result Value Ref Range   Hgb A1c MFr Bld 6.1 (H) 4.8 - 5.6 %    Comment:          Prediabetes: 5.7 - 6.4          Diabetes: >6.4          Glycemic control for adults with diabetes: <7.0    Est. average glucose Bld gHb Est-mCnc 128 mg/dL  TSH     Status: None   Collection Time: 07/04/23  8:13 AM  Result Value Ref Range   TSH 2.660 0.450 - 4.500 uIU/mL      Assessment & Plan:  Mucinex D Follow a strict diabetic diet Colonoscopy referral sent Mammogram order placed Referral sent to sleep studies Zepbound  sent to pharmacy  Problem List Items Addressed This Visit       Cardiovascular and Mediastinum   Essential hypertension - Primary     Musculoskeletal and Integument   Discoid lupus erythematosus     Other   Prediabetes   Vitamin D deficiency   Other Visit Diagnoses       Breast cancer screening by mammogram       Relevant Orders   MM 3D SCREENING MAMMOGRAM BILATERAL BREAST     Colon cancer screening       Relevant Orders   Ambulatory referral to Gastroenterology     Influenza A       Relevant Medications   albuterol  (VENTOLIN  HFA) 108 (90 Base) MCG/ACT inhaler     Mild intermittent asthma without complication       Relevant Medications   albuterol  (VENTOLIN  HFA) 108 (90 Base) MCG/ACT inhaler     Sleep apnea, unspecified type       Relevant Orders   Ambulatory referral to Sleep Studies       Return in about 6 weeks (around 08/17/2023).   Total time spent: 25  minutes  Google, NP  07/06/2023   This document may have been prepared by Dragon Voice Recognition software and as such may include unintentional dictation errors.

## 2023-07-07 ENCOUNTER — Telehealth: Payer: Self-pay

## 2023-07-07 DIAGNOSIS — Z1211 Encounter for screening for malignant neoplasm of colon: Secondary | ICD-10-CM

## 2023-07-07 MED ORDER — NA SULFATE-K SULFATE-MG SULF 17.5-3.13-1.6 GM/177ML PO SOLN: 1.0000 | Freq: Once | ORAL | 0 refills | Status: AC

## 2023-07-07 NOTE — Telephone Encounter (Signed)
 Gastroenterology Pre-Procedure Review  Request Date: 08/24/23 Requesting Physician: Dr. Ole Berkeley  PATIENT REVIEW QUESTIONS: The patient responded to the following health history questions as indicated:    1. Are you having any GI issues? no 2. Do you have a personal history of Polyps? no 3. Do you have a family history of Colon Cancer or Polyps? Maternal and paternal grandfather colon cancer 4. Diabetes Mellitus? Prediabetic recently prescribed Zepbound has not started yet 5. Joint replacements in the past 12 months?no 6. Major health problems in the past 3 months?no 7. Any artificial heart valves, MVP, or defibrillator?no    MEDICATIONS & ALLERGIES:    Patient reports the following regarding taking any anticoagulation/antiplatelet therapy:   Plavix, Coumadin, Eliquis, Xarelto, Lovenox, Pradaxa, Brilinta, or Effient? no Aspirin? no  Patient confirms/reports the following medications:  Current Outpatient Medications  Medication Sig Dispense Refill   albuterol  (VENTOLIN  HFA) 108 (90 Base) MCG/ACT inhaler Inhale 2 puffs into the lungs every 6 (six) hours as needed for wheezing or shortness of breath. 8 g 2   amLODipine -benazepril  (LOTREL) 10-20 MG capsule Take 1 capsule by mouth once daily 90 capsule 0   clobetasol ointment (TEMOVATE) 0.05 % Apply topically 2 (two) times daily.     fluticasone  (FLONASE ) 50 MCG/ACT nasal spray Place 1 spray into both nostrils daily. 15.8 mL 2   hydrOXYzine (ATARAX) 10 MG tablet Take 1 tablet (10 mg total) by mouth 3 (three) times daily as needed. 90 tablet 0   OPZELURA 1.5 % CREA      tirzepatide (ZEPBOUND) 2.5 MG/0.5ML Pen Inject 2.5 mg into the skin once a week. 2 mL 4   No current facility-administered medications for this visit.    Patient confirms/reports the following allergies:  Allergies  Allergen Reactions   Azithromycin Itching   Morphine Other (See Comments)    No orders of the defined types were placed in this  encounter.   AUTHORIZATION INFORMATION Primary Insurance: 1D#: Group #:  Secondary Insurance: 1D#: Group #:  SCHEDULE INFORMATION: Date: 08/29/23 Time: Location: [facility]

## 2023-07-12 ENCOUNTER — Encounter: Payer: Self-pay | Admitting: Cardiology

## 2023-07-12 ENCOUNTER — Other Ambulatory Visit: Payer: Self-pay

## 2023-07-12 DIAGNOSIS — Z1211 Encounter for screening for malignant neoplasm of colon: Secondary | ICD-10-CM

## 2023-07-12 NOTE — Telephone Encounter (Signed)
 Date correction on triage 08/29/23.  Order placed.  Thanks, [place], CMA

## 2023-08-17 ENCOUNTER — Ambulatory Visit: Admitting: Cardiology

## 2023-08-19 ENCOUNTER — Other Ambulatory Visit: Payer: Self-pay | Admitting: Cardiology

## 2023-08-28 ENCOUNTER — Telehealth: Payer: Self-pay

## 2023-08-28 NOTE — Telephone Encounter (Signed)
 Pt has requested to reschedule tomorrows colonoscopy.  Colonoscopy has been rescheduled to 12/05/23.  Trish in Endo notified.  Thanks,  [place], CMA

## 2023-10-21 ENCOUNTER — Other Ambulatory Visit: Payer: Self-pay | Admitting: Cardiology

## 2023-10-23 ENCOUNTER — Ambulatory Visit

## 2023-10-24 ENCOUNTER — Encounter: Payer: Self-pay | Admitting: Cardiology

## 2023-10-24 ENCOUNTER — Ambulatory Visit

## 2023-10-24 ENCOUNTER — Ambulatory Visit: Admitting: Cardiology

## 2023-10-24 VITALS — BP 140/94 | HR 85 | Ht 63.0 in | Wt 229.2 lb

## 2023-10-24 DIAGNOSIS — R7303 Prediabetes: Secondary | ICD-10-CM | POA: Diagnosis not present

## 2023-10-24 DIAGNOSIS — I10 Essential (primary) hypertension: Secondary | ICD-10-CM

## 2023-10-24 DIAGNOSIS — M79671 Pain in right foot: Secondary | ICD-10-CM | POA: Insufficient documentation

## 2023-10-24 DIAGNOSIS — J0110 Acute frontal sinusitis, unspecified: Secondary | ICD-10-CM | POA: Diagnosis not present

## 2023-10-24 DIAGNOSIS — R6889 Other general symptoms and signs: Secondary | ICD-10-CM

## 2023-10-24 LAB — POCT XPERT XPRESS SARS COVID-2/FLU/RSV
FLU A: NEGATIVE
FLU B: NEGATIVE
RSV RNA, PCR: NEGATIVE
SARS Coronavirus 2: NEGATIVE

## 2023-10-24 MED ORDER — MELOXICAM 7.5 MG PO TABS: 7.5000 mg | ORAL_TABLET | Freq: Every day | ORAL | 2 refills | Status: AC

## 2023-10-24 MED ORDER — AMOXICILLIN-POT CLAVULANATE 875-125 MG PO TABS: 1.0000 | ORAL_TABLET | Freq: Two times a day (BID) | ORAL | 0 refills | Status: AC

## 2023-10-24 NOTE — Progress Notes (Signed)
 Established Patient Office Visit  Subjective:  Patient ID: Gwendolyn Grant, female    DOB: 1976/01/14  Age: 48 y.o. MRN: [id number]  Chief Complaint  Patient presents with   Acute Visit    Headache, body aches, nausea, negative for covid. Recently diagnosed with lupus.    Patient in office for an acute visit, complaining of headache, body aches, and nausea. Negative Covid, Flu, RSV this morning. Patient reports headache frontal sinus area, cough, nasal congestion. Will send in Augmentin . Recommend using nasal spray. Drink plenty of water. Patient also complaining of right foot pain, bilateral swelling. Recommend compression hose, will send in meloxicam.  Sinus Problem This is a new problem. The current episode started in the past 7 days. The problem is unchanged. There has been no fever. Associated symptoms include congestion, coughing, headaches and sinus pressure. Pertinent negatives include no ear pain, shortness of breath or sore throat. Past treatments include acetaminophen. The treatment provided no relief.    No other concerns at this time.   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     Social History   Socioeconomic History   Marital status: Married    Spouse name: Not on file   Number of children: Not on file   Years of education: Not on file   Highest education level: Not on file  Occupational History   Not on file  Tobacco Use   Smoking status: Every Day    Types: Cigarettes   Smokeless tobacco: Never  Vaping Use    Vaping status: Not on file  Substance and Sexual Activity   Alcohol use: Not Currently   Drug use: Yes    Frequency: 1.0 times per week    Types: Marijuana   Sexual activity: Not on file  Other Topics Concern   Not on file  Social History Narrative   Not on file   Social Drivers of Health   Financial Resource Strain: Low Risk  (03/21/2023)   Received from [facility]   Overall Financial Resource Strain (CARDIA)    Difficulty of Paying Living Expenses: Not very hard  Food Insecurity: No Food Insecurity (03/21/2023)   Received from [facility]   Hunger Vital Sign    Within the past 12 months, you worried that your food would run out before you got the money to buy more.: Never true    Within the past 12 months, the food you bought just didn't last and you didn't have money to get more.: Never true  Transportation Needs: No Transportation Needs (03/21/2023)   Received from [facility] - Transportation    In the past 12 months, has lack of transportation kept you from medical appointments or from getting medications?: No    Lack of Transportation (Non-Medical): No  Physical Activity: Not on file  Stress: Not on file  Social Connections: Not on file  Intimate Partner Violence: Not on file    History reviewed. No pertinent family history.  Allergies  Allergen  Reactions   Azithromycin Itching   Morphine Other (See Comments)    Outpatient Medications Prior to Visit  Medication Sig   albuterol  (VENTOLIN  HFA) 108 (90 Base) MCG/ACT inhaler Inhale 2 puffs into the lungs every 6 (six) hours as needed for wheezing or shortness of breath.   amLODipine -benazepril  (LOTREL) 10-20 MG capsule Take 1 capsule by mouth once daily   clobetasol ointment (TEMOVATE) 0.05 % Apply topically 2 (two) times daily.   hydrOXYzine  (ATARAX ) 10 MG tablet Take 1 tablet (10 mg total) by mouth 3 (three) times daily as needed.   OPZELURA 1.5 % CREA     fluticasone  (FLONASE ) 50 MCG/ACT nasal spray Place 1 spray into both nostrils daily. (Patient not taking: Reported on 10/24/2023)   tirzepatide  (ZEPBOUND ) 2.5 MG/0.5ML Pen Inject 2.5 mg into the skin once a week. (Patient not taking: Reported on 10/24/2023)   No facility-administered medications prior to visit.    Review of Systems  Constitutional: Negative.        Body aches  HENT:  Positive for congestion, sinus pressure and sinus pain. Negative for ear pain and sore throat.   Eyes: Negative.   Respiratory:  Positive for cough and sputum production. Negative for shortness of breath.   Gastrointestinal:  Negative for constipation and diarrhea.  Genitourinary: Negative.   Skin: Negative.   Neurological:  Positive for headaches.  Endo/Heme/Allergies: Negative.   Psychiatric/Behavioral: Negative.    All other systems reviewed and are negative.      Objective:   BP (!) 140/94   Pulse 85   Ht 5' 3 (1.6 m)   Wt 229 lb 3.2 oz (104 kg)   SpO2 99%   BMI 40.60 kg/m   Vitals:   10/24/23 1358  BP: (!) 140/94  Pulse: 85  Height: 5' 3 (1.6 m)  Weight: 229 lb 3.2 oz (104 kg)  SpO2: 99%  BMI (Calculated): 40.61    Physical Exam Vitals and nursing note reviewed.  Constitutional:      Appearance: Normal appearance. She is normal weight.  HENT:     Head: Normocephalic and atraumatic.     Nose: Nose normal.     Mouth/Throat:     Mouth: Mucous membranes are moist.  Eyes:     Extraocular Movements: Extraocular movements intact.     Conjunctiva/sclera: Conjunctivae normal.     Pupils: Pupils are equal, round, and reactive to light.  Cardiovascular:     Rate and Rhythm: Normal rate and regular rhythm.     Pulses: Normal pulses.     Heart sounds: Normal heart sounds.  Pulmonary:     Effort: Pulmonary effort is normal.     Breath sounds: Normal breath sounds.  Abdominal:     General: Abdomen is flat. Bowel sounds are normal.     Palpations: Abdomen is soft.  Musculoskeletal:         General: Normal range of motion.     Cervical back: Normal range of motion.  Skin:    General: Skin is warm and dry.  Neurological:     General: No focal deficit present.     Mental Status: She is alert and oriented to person, place, and time.  Psychiatric:        Mood and Affect: Mood normal.        Behavior: Behavior normal.        Thought Content: Thought content normal.        Judgment: Judgment normal.      Results  for orders placed or performed in visit on 10/24/23  POCT XPERT XPRESS SARS COVID-2/FLU/RSV [[id number]]  Result Value Ref Range   SARS Coronavirus 2 Negative    FLU A Negative    FLU B Negative    RSV RNA, PCR Negative     Recent Results (from the past 2160 hours)  POCT XPERT XPRESS SARS COVID-2/FLU/RSV [[id number]]     Status: Normal   Collection Time: 10/24/23 12:14 PM  Result Value Ref Range   SARS Coronavirus 2 Negative    FLU A Negative    FLU B Negative    RSV RNA, PCR Negative       Assessment & Plan:  Augmentin  Nasal spray Meloxicam Drink plenty of water  Problem List Items Addressed This Visit       Respiratory   Acute non-recurrent frontal sinusitis - Primary   Relevant Medications   amoxicillin -clavulanate (AUGMENTIN ) 875-125 MG tablet     Other   Right foot pain    Return in about 4 weeks (around 11/21/2023) for fasitng lab work prior.   Total time spent: 25 minutes  [employer], NP  10/24/2023   This document may have been prepared by Dragon Voice Recognition software and as such may include unintentional dictation errors.

## 2023-10-25 ENCOUNTER — Other Ambulatory Visit: Payer: Self-pay | Admitting: Cardiology

## 2023-11-14 ENCOUNTER — Other Ambulatory Visit: Payer: Self-pay | Admitting: Cardiology

## 2023-12-04 ENCOUNTER — Ambulatory Visit: Admitting: Cardiology

## 2023-12-04 ENCOUNTER — Encounter: Payer: Self-pay | Admitting: Cardiology

## 2023-12-04 ENCOUNTER — Ambulatory Visit: Payer: Self-pay | Admitting: Cardiology

## 2023-12-04 VITALS — BP 116/82 | HR 100 | Ht 63.0 in | Wt 225.4 lb

## 2023-12-04 DIAGNOSIS — Z131 Encounter for screening for diabetes mellitus: Secondary | ICD-10-CM

## 2023-12-04 DIAGNOSIS — R350 Frequency of micturition: Secondary | ICD-10-CM | POA: Insufficient documentation

## 2023-12-04 DIAGNOSIS — I10 Essential (primary) hypertension: Secondary | ICD-10-CM | POA: Diagnosis not present

## 2023-12-04 DIAGNOSIS — B379 Candidiasis, unspecified: Secondary | ICD-10-CM | POA: Insufficient documentation

## 2023-12-04 DIAGNOSIS — N898 Other specified noninflammatory disorders of vagina: Secondary | ICD-10-CM | POA: Diagnosis not present

## 2023-12-04 LAB — POCT URINALYSIS DIPSTICK
Bilirubin, UA: NEGATIVE
Blood, UA: NEGATIVE
Glucose, UA: NEGATIVE
Ketones, UA: NEGATIVE
Leukocytes, UA: NEGATIVE
Nitrite, UA: NEGATIVE
Protein, UA: POSITIVE — AB
Spec Grav, UA: 1.015 (ref 1.010–1.025)
Urobilinogen, UA: 0.2 U/dL
pH, UA: 6 (ref 5.0–8.0)

## 2023-12-04 MED ORDER — FLUCONAZOLE 150 MG PO TABS: ORAL_TABLET | ORAL | 0 refills | Status: AC

## 2023-12-04 NOTE — Progress Notes (Signed)
 Established Patient Office Visit  Subjective:  Patient ID: Gwendolyn Grant, female    DOB: 10/09/1975  Age: 48 y.o. MRN: [id number]  Chief Complaint  Patient presents with   Acute Visit    Possible yeast infection    Patient in office for an acute visit. Patient complaining of vaginal itching and discharge. UA unremarkable today. Will send for culture. Will do a BV swab. Will send in diflucan. Patient scheduled for colonoscopy tomorrow.  Patient doing well otherwise. Blood pressure improved on recheck.     No other concerns at this time.   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     Social History   Socioeconomic History   Marital status: Married    Spouse name: Not on file   Number of children: Not on file   Years of education: Not on file   Highest education level: Not on file  Occupational History   Not on file  Tobacco Use   Smoking status: Every Day    Types: Cigarettes   Smokeless tobacco: Never  Vaping Use   Vaping status: Not on file  Substance and Sexual Activity   Alcohol use: Not Currently   Drug use: Yes    Frequency: 1.0 times per week    Types: Marijuana   Sexual activity: Not on file  Other Topics Concern   Not on file  Social History Narrative   Not on file   Social Drivers of Health   Financial Resource Strain: Low Risk  (03/21/2023)   Received from [facility]   Overall Financial Resource Strain (CARDIA)    Difficulty of Paying Living Expenses: Not very hard  Food Insecurity:  No Food Insecurity (03/21/2023)   Received from [facility]   Hunger Vital Sign    Within the past 12 months, you worried that your food would run out before you got the money to buy more.: Never true    Within the past 12 months, the food you bought just didn't last and you didn't have money to get more.: Never true  Transportation Needs: No Transportation Needs (03/21/2023)   Received from [facility] - Transportation    In the past 12 months, has lack of transportation kept you from medical appointments or from getting medications?: No    Lack of Transportation (Non-Medical): No  Physical Activity: Not on file  Stress: Not on file  Social Connections: Not on file  Intimate Partner Violence: Not on file    History reviewed. No pertinent family history.  Allergies  Allergen Reactions   Azithromycin Itching   Morphine Other (See Comments)    Outpatient Medications Prior to Visit  Medication Sig   albuterol  (VENTOLIN  HFA) 108 (90 Base) MCG/ACT inhaler Inhale 2 puffs into the lungs every 6 (six) hours as needed for wheezing or shortness of breath.   amLODipine -benazepril  (LOTREL) 10-20 MG capsule Take 1 capsule by mouth once daily   clobetasol ointment (TEMOVATE) 0.05 % Apply topically 2 (two)  times daily.   hydrOXYzine  (ATARAX ) 10 MG tablet Take 1 tablet by mouth three times daily as needed   meloxicam (MOBIC) 7.5 MG tablet Take 1 tablet (7.5 mg total) by mouth daily.   OPZELURA 1.5 % CREA    [DISCONTINUED] fluticasone  (FLONASE ) 50 MCG/ACT nasal spray Place 1 spray into both nostrils daily. (Patient not taking: Reported on 12/04/2023)   [DISCONTINUED] tirzepatide  (ZEPBOUND ) 2.5 MG/0.5ML Pen Inject 2.5 mg into the skin once a week. (Patient not taking: Reported on 12/04/2023)   No facility-administered medications prior to visit.    Review of Systems  Constitutional: Negative.   HENT: Negative.    Eyes: Negative.   Respiratory:  Negative.  Negative for shortness of breath.   Cardiovascular: Negative.  Negative for chest pain.  Gastrointestinal: Negative.  Negative for abdominal pain, constipation and diarrhea.  Genitourinary: Negative.   Musculoskeletal:  Negative for joint pain and myalgias.  Skin: Negative.   Neurological: Negative.  Negative for dizziness and headaches.  Endo/Heme/Allergies: Negative.   All other systems reviewed and are negative.      Objective:   BP 116/82 (BP Location: Right Arm, Patient Position: Sitting, Cuff Size: Large)   Pulse 100   Ht 5' 3 (1.6 m)   Wt 225 lb 6.4 oz (102.2 kg)   SpO2 99%   BMI 39.93 kg/m   Vitals:   12/04/23 1427 12/04/23 1448  BP: (!) 136/92 116/82  Pulse: 100   Height: 5' 3 (1.6 m)   Weight: 225 lb 6.4 oz (102.2 kg)   SpO2: 99%   BMI (Calculated): 39.94     Physical Exam Vitals and nursing note reviewed.  Constitutional:      Appearance: Normal appearance. She is normal weight.  HENT:     Head: Normocephalic and atraumatic.     Nose: Nose normal.     Mouth/Throat:     Mouth: Mucous membranes are moist.  Eyes:     Extraocular Movements: Extraocular movements intact.     Conjunctiva/sclera: Conjunctivae normal.     Pupils: Pupils are equal, round, and reactive to light.  Cardiovascular:     Rate and Rhythm: Normal rate and regular rhythm.     Pulses: Normal pulses.     Heart sounds: Normal heart sounds.  Pulmonary:     Effort: Pulmonary effort is normal.     Breath sounds: Normal breath sounds.  Abdominal:     General: Abdomen is flat. Bowel sounds are normal.     Palpations: Abdomen is soft.  Musculoskeletal:        General: Normal range of motion.     Cervical back: Normal range of motion.  Skin:    General: Skin is warm and dry.  Neurological:     General: No focal deficit present.     Mental Status: She is alert and oriented to person, place, and time.  Psychiatric:        Mood and Affect: Mood normal.        Behavior:  Behavior normal.        Thought Content: Thought content normal.        Judgment: Judgment normal.      Results for orders placed or performed in visit on 12/04/23  POCT urinalysis dipstick  Result Value Ref Range   Color, UA     Clarity, UA     Glucose, UA Negative Negative   Bilirubin, UA Negative    Ketones, UA negative    Spec Grav, UA  1.015 1.010 - 1.025   Blood, UA negative    pH, UA 6.0 5.0 - 8.0   Protein, UA Positive (A) Negative   Urobilinogen, UA 0.2 0.2 or 1.0 E.U./dL   Nitrite, UA Negative    Leukocytes, UA Negative Negative   Appearance     Odor      Recent Results (from the past 2160 hours)  POCT XPERT XPRESS SARS COVID-2/FLU/RSV [[id number]]     Status: Normal   Collection Time: 10/24/23 12:14 PM  Result Value Ref Range   SARS Coronavirus 2 Negative    FLU A Negative    FLU B Negative    RSV RNA, PCR Negative   POCT urinalysis dipstick     Status: Abnormal   Collection Time: 12/04/23  2:36 PM  Result Value Ref Range   Color, UA     Clarity, UA     Glucose, UA Negative Negative   Bilirubin, UA Negative    Ketones, UA negative    Spec Grav, UA 1.015 1.010 - 1.025   Blood, UA negative    pH, UA 6.0 5.0 - 8.0   Protein, UA Positive (A) Negative   Urobilinogen, UA 0.2 0.2 or 1.0 E.U./dL   Nitrite, UA Negative    Leukocytes, UA Negative Negative   Appearance     Odor        Assessment & Plan:  Urine culture BV swab Diflucan Continue current medications  Problem List Items Addressed This Visit       Cardiovascular and Mediastinum   Essential hypertension     Genitourinary   Vaginal itching - Primary   Relevant Orders   BV+Ct/Ng/Tv NAA+Yeast Culture     Other   Frequent urination   Relevant Orders   POCT urinalysis dipstick (Completed)   Urine Culture   Yeast infection   Relevant Medications   fluconazole (DIFLUCAN) 150 MG tablet    Return in about 4 weeks (around 01/01/2024) for fasting lab work prior.   Total time spent: 25  minutes. This time includes review of previous notes and results and patient face to face interaction during today's visit.    Jeoffrey Pollen, NP  12/04/2023   This document may have been prepared by Dragon Voice Recognition software and as such may include unintentional dictation errors.

## 2023-12-05 ENCOUNTER — Ambulatory Visit: Admitting: Anesthesiology

## 2023-12-05 ENCOUNTER — Other Ambulatory Visit: Payer: Self-pay

## 2023-12-05 ENCOUNTER — Ambulatory Visit
Admission: RE | Admit: 2023-12-05 | Discharge: 2023-12-05 | Disposition: A | Discharge status: Home / Self Care | Attending: Gastroenterology | Admitting: Gastroenterology

## 2023-12-05 ENCOUNTER — Encounter: Payer: Self-pay | Admitting: Gastroenterology

## 2023-12-05 ENCOUNTER — Encounter
Admission: RE | Disposition: A | Discharge status: Home / Self Care | Payer: Self-pay | Source: Home / Self Care | Attending: Gastroenterology

## 2023-12-05 DIAGNOSIS — D128 Benign neoplasm of rectum: Secondary | ICD-10-CM | POA: Insufficient documentation

## 2023-12-05 DIAGNOSIS — K635 Polyp of colon: Secondary | ICD-10-CM | POA: Diagnosis not present

## 2023-12-05 DIAGNOSIS — I10 Essential (primary) hypertension: Secondary | ICD-10-CM | POA: Diagnosis not present

## 2023-12-05 DIAGNOSIS — K5730 Diverticulosis of large intestine without perforation or abscess without bleeding: Secondary | ICD-10-CM | POA: Insufficient documentation

## 2023-12-05 DIAGNOSIS — Z1211 Encounter for screening for malignant neoplasm of colon: Secondary | ICD-10-CM | POA: Diagnosis not present

## 2023-12-05 DIAGNOSIS — F17210 Nicotine dependence, cigarettes, uncomplicated: Secondary | ICD-10-CM | POA: Diagnosis not present

## 2023-12-05 HISTORY — PX: COLONOSCOPY: SHX5424

## 2023-12-05 HISTORY — PX: POLYPECTOMY: SHX149

## 2023-12-05 SURGERY — COLONOSCOPY
Anesthesia: General

## 2023-12-05 MED ORDER — DEXMEDETOMIDINE HCL IN NACL 80 MCG/20ML IV SOLN
INTRAVENOUS | Status: AC
  Filled 2023-12-05: qty 20

## 2023-12-05 MED ORDER — DEXMEDETOMIDINE HCL IN NACL 80 MCG/20ML IV SOLN
INTRAVENOUS | Status: DC | PRN
Start: 2023-12-05 — End: 2023-12-05
  Administered 2023-12-05: 8 ug via INTRAVENOUS
  Administered 2023-12-05: 12 ug via INTRAVENOUS

## 2023-12-05 MED ORDER — LIDOCAINE HCL (PF) 2 % IJ SOLN
INTRAMUSCULAR | Status: AC
  Filled 2023-12-05: qty 5

## 2023-12-05 MED ORDER — PROPOFOL 500 MG/50ML IV EMUL
INTRAVENOUS | Status: DC | PRN
Start: 2023-12-05 — End: 2023-12-05
  Administered 2023-12-05: 75 ug/kg/min via INTRAVENOUS

## 2023-12-05 MED ORDER — PROPOFOL 10 MG/ML IV BOLUS
INTRAVENOUS | Status: DC | PRN
Start: 2023-12-05 — End: 2023-12-05
  Administered 2023-12-05 (×2): 50 mg via INTRAVENOUS

## 2023-12-05 MED ORDER — SODIUM CHLORIDE 0.9 % IV SOLN: INTRAVENOUS | Status: DC

## 2023-12-05 MED ORDER — LIDOCAINE HCL (CARDIAC) PF 100 MG/5ML IV SOSY
PREFILLED_SYRINGE | INTRAVENOUS | Status: DC | PRN
  Administered 2023-12-05: 80 mg via INTRAVENOUS

## 2023-12-05 MED ORDER — PROPOFOL 1000 MG/100ML IV EMUL
INTRAVENOUS | Status: AC
  Filled 2023-12-05: qty 100

## 2023-12-05 NOTE — Anesthesia Preprocedure Evaluation (Addendum)
 Anesthesia Evaluation  Patient identified by MRN, date of birth, ID band Patient awake    Reviewed: Allergy & Precautions, H&P , NPO status , Patient's Chart, lab work & pertinent test results  Airway Mallampati: II  TM Distance: >3 FB Neck ROM: Full    Dental no notable dental hx.    Pulmonary Current Smoker   breath sounds clear to auscultation       Cardiovascular hypertension,  Rhythm:Regular     Neuro/Psych negative neurological ROS  negative psych ROS   GI/Hepatic negative GI ROS, Neg liver ROS,,,  Endo/Other  negative endocrine ROS    Renal/GU negative Renal ROS  negative genitourinary   Musculoskeletal negative musculoskeletal ROS (+)    Abdominal   Peds negative pediatric ROS (+)  Hematology negative hematology ROS (+)   Anesthesia Other Findings   Reproductive/Obstetrics negative OB ROS                              Anesthesia Physical Anesthesia Plan  ASA: 2  Anesthesia Plan: General   Post-op Pain Management:    Induction: Intravenous  PONV Risk Score and Plan: Propofol infusion and TIVA  Airway Management Planned: Natural Airway and Nasal Cannula  Additional Equipment:   Intra-op Plan:   Post-operative Plan:   Informed Consent: I have reviewed the patients History and Physical, chart, labs and discussed the procedure including the risks, benefits and alternatives for the proposed anesthesia with the patient or authorized representative who has indicated his/her understanding and acceptance.       Plan Discussed with:   Anesthesia Plan Comments: (Ivga with LMA as backup )         Anesthesia Quick Evaluation

## 2023-12-05 NOTE — Transfer of Care (Signed)
 Immediate Anesthesia Transfer of Care Note  Patient: Gwendolyn Grant  Procedure(s) Performed: COLONOSCOPY POLYPECTOMY, INTESTINE  Patient Location: PACU  Anesthesia Type:General  Level of Consciousness: sedated  Airway & Oxygen Therapy: Patient Spontanous Breathing  Post-op Assessment: Report given to RN and Post -op Vital signs reviewed and stable  Post vital signs: Reviewed and stable  Last Vitals:  Vitals Value Taken Time  BP 108/62 12/05/23 10:47  Temp    Pulse 85 12/05/23 10:48  Resp 18 12/05/23 10:48  SpO2 100 % 12/05/23 10:48  Vitals shown include unfiled device data.  Last Pain:  Vitals:   12/05/23 0946  TempSrc: Tympanic  PainSc: 0-No pain         Complications: No notable events documented.

## 2023-12-05 NOTE — H&P (Signed)
 Gwendolyn Copping, MD [facility] [address], [address] [place], [place] [address] Phone: [phone] Fax : [phone]  Primary Care Physician:  Carin Gauze, NP Primary Gastroenterologist:  Dr. Copping  Pre-Procedure History & Physical: HPI:  Gwendolyn Grant is a 48 y.o. female is here for a screening colonoscopy.   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 Authorizing Provider  albuterol  (VENTOLIN  HFA) 108 (90 Base) MCG/ACT inhaler Inhale 2 puffs into the lungs every 6 (six) hours as needed for wheezing or shortness of breath. 07/06/23   Scoggins, Amber, NP  amLODipine -benazepril  (LOTREL) 10-20 MG capsule Take 1 capsule by mouth once daily 11/14/23   Scoggins, Amber, NP  clobetasol ointment (TEMOVATE) 0.05 % Apply topically 2 (two) times daily. 01/20/23   [provider]  fluconazole (DIFLUCAN) 150 MG tablet Take one tablet every 72 hours 12/04/23   Scoggins, [facility], NP  hydrOXYzine  (ATARAX ) 10 MG tablet Take 1 tablet by mouth three times daily as needed 10/26/23   Scoggins, [facility], NP  meloxicam (MOBIC) 7.5 MG tablet Take 1 tablet (7.5 mg total) by mouth daily. 10/24/23 10/23/24  Scoggins, Amber, NP  OPZELURA 1.5 % CREA  02/21/23   [provider]    Allergies as of 07/12/2023 - Review Complete 07/06/2023  Allergen Reaction Noted   Azithromycin Itching 06/01/2021   Morphine Other (See Comments) 02/22/2021    No family history on file.  Social History    Socioeconomic History   Marital status: Married    Spouse name: Not on file   Number of children: Not on file   Years of education: Not on file   Highest education level: Not on file  Occupational History   Not on file  Tobacco Use   Smoking status: Every Day    Types: Cigarettes   Smokeless tobacco: Never  Vaping Use   Vaping status: Not on file  Substance and Sexual Activity   Alcohol use: Not Currently   Drug use: Yes    Frequency: 1.0 times per week    Types: Marijuana   Sexual activity: Not on file  Other Topics Concern   Not on file  Social History Narrative   Not on file   Social Drivers of Health   Financial Resource Strain: Low Risk  (03/21/2023)   Received from [facility]   Overall Financial Resource Strain (CARDIA)    Difficulty of Paying Living Expenses: Not very hard  Food Insecurity: No Food Insecurity (03/21/2023)   Received from [facility]   Hunger Vital Sign    Within the past 12 months, you worried that your food would run out before you got the money to buy more.: Never true    Within the past 12 months, the food you bought just didn't last and you didn't have money to get more.: Never true  Transportation Needs: No Transportation Needs (03/21/2023)   Received from [facility]  PRAPARE - Transportation    In the past 12 months, has lack of transportation kept you from medical appointments or from getting medications?: No    Lack of Transportation (Non-Medical): No  Physical Activity: Not on file  Stress: Not on file  Social Connections: Not on file  Intimate Partner Violence: Not on file    Review of Systems: See HPI, otherwise negative ROS  Physical Exam: BP (!) 139/91   Pulse 100   Temp (!) 97.1 F (36.2 C) (Tympanic)   Resp 18   Ht 5' 3 (1.6 m)   Wt 101.7 kg   SpO2 100%   BMI 39.72 kg/m  General:   Alert,  pleasant and cooperative in NAD Head:  Normocephalic and  atraumatic. Neck:  Supple; no masses or thyromegaly. Lungs:  Clear throughout to auscultation.    Heart:  Regular rate and rhythm. Abdomen:  Soft, nontender and nondistended. Normal bowel sounds, without guarding, and without rebound.   Neurologic:  Alert and  oriented x4;  grossly normal neurologically.  Impression/Plan: Gwendolyn Grant is now here to undergo a screening colonoscopy.  Risks, benefits, and alternatives regarding colonoscopy have been reviewed with the patient.  Questions have been answered.  All parties agreeable.

## 2023-12-05 NOTE — Anesthesia Postprocedure Evaluation (Signed)
 Anesthesia Post Note  Patient: Gwendolyn Grant  Procedure(s) Performed: COLONOSCOPY POLYPECTOMY, INTESTINE  Patient location during evaluation: PACU Anesthesia Type: General Level of consciousness: awake and alert Pain management: pain level controlled Vital Signs Assessment: post-procedure vital signs reviewed and stable Respiratory status: spontaneous breathing, nonlabored ventilation, respiratory function stable and patient connected to nasal cannula oxygen Cardiovascular status: blood pressure returned to baseline and stable Postop Assessment: no apparent nausea or vomiting Anesthetic complications: no   No notable events documented.   Last Vitals:  Vitals:   12/05/23 0946  BP: (!) 139/91  Pulse: 100  Resp: 18  Temp: (!) 36.2 C  SpO2: 100%    Last Pain:  Vitals:   12/05/23 0946  TempSrc: Tympanic  PainSc: 0-No pain                 Shau-Shau Melia

## 2023-12-05 NOTE — Op Note (Signed)
 [facility]astroenterology Patient Name: Gwendolyn Grant Procedure Date: 12/05/2023 10:28 AM MRN: [id number] Account #: [id number] Date of Birth: [birth year] Admit Type: Outpatient Age: 48 Room: [facility] ENDO ROOM 4 Gender: Female Note Status: Finalized Instrument Name: Colon Scope [id number] Procedure:             Colonoscopy Indications:           Screening for colorectal malignant neoplasm Providers:             Rogelia Copping MD, MD Referring MD:          No Local Md, MD (Referring MD) Medicines:             Propofol per Anesthesia Complications:         No immediate complications. Procedure:             Pre-Anesthesia Assessment:                        - Prior to the procedure, a History and Physical was                         performed, and patient medications and allergies were                         reviewed. The patient's tolerance of previous                         anesthesia was also reviewed. The risks and benefits                         of the procedure and the sedation options and risks                         were discussed with the patient. All questions were                         answered, and informed consent was obtained. Prior                         Anticoagulants: The patient has taken no anticoagulant                         or antiplatelet agents. ASA Grade Assessment: II - A                         patient with mild systemic disease. After reviewing                         the risks and benefits, the patient was deemed in                         satisfactory condition to undergo the procedure.                        After obtaining informed consent, the colonoscope was                         passed under direct vision. Throughout the procedure,  the patient's blood pressure, pulse, and oxygen                         saturations were monitored continuously. The                         Colonoscope was introduced through the  anus and                         advanced to the the cecum, identified by appendiceal                         orifice and ileocecal valve. The colonoscopy was                         performed without difficulty. The patient tolerated                         the procedure well. The quality of the bowel                         preparation was excellent. Findings:      The perianal and digital rectal examinations were normal.      Many small-mouthed diverticula were found in the entire colon.      A 3 mm polyp was found in the rectum. The polyp was sessile. The polyp       was removed with a cold biopsy forceps. Resection and retrieval were       complete. Impression:            - Diverticulosis in the entire examined colon.                        - One 3 mm polyp in the rectum, removed with a cold                         biopsy forceps. Resected and retrieved. Recommendation:        - Discharge patient to home.                        - Resume previous diet.                        - Continue present medications.                        - Repeat colonoscopy in 10 years for screening                         purposes. Procedure Code(s):     --- Professional ---                        [id number], Colonoscopy, flexible; with biopsy, single or                         multiple Diagnosis Code(s):     --- Professional ---                        Z12.11, Encounter for screening for malignant  neoplasm                         of colon                        D12.8, Benign neoplasm of rectum CPT copyright 2022 [employer]. All rights reserved. The codes documented in this report are preliminary and upon coder review may  be revised to meet current compliance requirements. Rogelia Copping MD, MD 12/05/2023 10:45:37 AM This report has been signed electronically. Number of Addenda: 0 Note Initiated On: 12/05/2023 10:28 AM Scope Withdrawal Time: 0 hours 8 minutes 11 seconds  Total Procedure  Duration: 0 hours 10 minutes 26 seconds  Estimated Blood Loss:  Estimated blood loss: none.      [facility]

## 2023-12-06 LAB — URINE CULTURE

## 2023-12-06 LAB — SURGICAL PATHOLOGY

## 2023-12-06 NOTE — Progress Notes (Signed)
Pt informed

## 2023-12-07 ENCOUNTER — Ambulatory Visit: Attending: Sleep Medicine

## 2023-12-07 DIAGNOSIS — G4733 Obstructive sleep apnea (adult) (pediatric): Secondary | ICD-10-CM | POA: Insufficient documentation

## 2023-12-08 ENCOUNTER — Ambulatory Visit: Payer: Self-pay | Admitting: Gastroenterology

## 2023-12-22 ENCOUNTER — Encounter: Payer: Self-pay | Admitting: Cardiology

## 2024-01-09 LAB — BV+CT/NG/TV NAA+YEAST CULTURE
Chlamydia by NAA: NEGATIVE
Gonococcus by NAA: NEGATIVE
Trich vag by NAA: NEGATIVE
Vaginal Yeast Culture: NEGATIVE

## 2024-02-07 ENCOUNTER — Other Ambulatory Visit: Payer: Self-pay | Admitting: Cardiology

## 2024-02-23 ENCOUNTER — Other Ambulatory Visit: Payer: Self-pay | Admitting: Cardiology
